# Patient Record
Sex: Female | Born: 1985 | Race: Black or African American | Hispanic: No | Marital: Married | Smoking: Never smoker
Health system: Southern US, Community
[De-identification: ages and names within clinical notes are randomized; demographics above are authoritative.]

## PROBLEM LIST (undated history)

## (undated) DIAGNOSIS — Z8619 Personal history of other infectious and parasitic diseases: Secondary | ICD-10-CM

## (undated) HISTORY — PX: FOOT SURGERY: SHX648

## (undated) HISTORY — DX: Personal history of other infectious and parasitic diseases: Z86.19

---

## 2010-11-06 ENCOUNTER — Other Ambulatory Visit (HOSPITAL_COMMUNITY)
Admission: RE | Admit: 2010-11-06 | Discharge: 2010-11-06 | Disposition: A | Discharge status: Home / Self Care | Payer: BC Managed Care – PPO | Source: Ambulatory Visit | Attending: Obstetrics and Gynecology | Admitting: Obstetrics and Gynecology

## 2010-11-06 DIAGNOSIS — Z113 Encounter for screening for infections with a predominantly sexual mode of transmission: Secondary | ICD-10-CM | POA: Insufficient documentation

## 2010-11-06 DIAGNOSIS — Z01419 Encounter for gynecological examination (general) (routine) without abnormal findings: Secondary | ICD-10-CM | POA: Insufficient documentation

## 2011-09-10 LAB — OB RESULTS CONSOLE HIV ANTIBODY (ROUTINE TESTING): HIV: NONREACTIVE

## 2011-09-10 LAB — OB RESULTS CONSOLE HEPATITIS B SURFACE ANTIGEN: Hepatitis B Surface Ag: NEGATIVE

## 2011-09-10 LAB — OB RESULTS CONSOLE RUBELLA ANTIBODY, IGM: Rubella: IMMUNE

## 2011-09-10 LAB — OB RESULTS CONSOLE ABO/RH

## 2011-09-10 LAB — OB RESULTS CONSOLE GC/CHLAMYDIA: Chlamydia: NEGATIVE

## 2011-09-10 LAB — OB RESULTS CONSOLE RPR: RPR: NONREACTIVE

## 2012-04-10 ENCOUNTER — Telehealth (HOSPITAL_COMMUNITY): Payer: Self-pay | Admitting: *Deleted

## 2012-04-10 ENCOUNTER — Encounter (HOSPITAL_COMMUNITY): Payer: Self-pay | Admitting: *Deleted

## 2012-04-10 NOTE — Telephone Encounter (Signed)
Preadmission screen  

## 2012-04-13 ENCOUNTER — Telehealth (HOSPITAL_COMMUNITY): Payer: Self-pay | Admitting: *Deleted

## 2012-04-13 ENCOUNTER — Encounter (HOSPITAL_COMMUNITY): Payer: Self-pay | Admitting: *Deleted

## 2012-04-13 NOTE — Telephone Encounter (Signed)
Preadmission screen  

## 2012-04-16 ENCOUNTER — Telehealth (HOSPITAL_COMMUNITY): Payer: Self-pay | Admitting: *Deleted

## 2012-04-16 NOTE — Telephone Encounter (Signed)
Preadmission screen  

## 2012-04-20 ENCOUNTER — Encounter (HOSPITAL_COMMUNITY): Payer: Self-pay | Admitting: *Deleted

## 2012-04-20 ENCOUNTER — Inpatient Hospital Stay (HOSPITAL_COMMUNITY)
Admission: AD | Admit: 2012-04-20 | Discharge: 2012-04-24 | DRG: 371 | Disposition: A | Discharge status: Home / Self Care | Payer: BC Managed Care – PPO | Source: Ambulatory Visit | Attending: Obstetrics and Gynecology | Admitting: Obstetrics and Gynecology

## 2012-04-20 DIAGNOSIS — O4100X0 Oligohydramnios, unspecified trimester, not applicable or unspecified: Principal | ICD-10-CM

## 2012-04-20 DIAGNOSIS — O480 Post-term pregnancy: Secondary | ICD-10-CM

## 2012-04-20 DIAGNOSIS — O99892 Other specified diseases and conditions complicating childbirth: Secondary | ICD-10-CM | POA: Diagnosis present

## 2012-04-20 DIAGNOSIS — Z22330 Carrier of Group B streptococcus: Secondary | ICD-10-CM

## 2012-04-20 DIAGNOSIS — Z98891 History of uterine scar from previous surgery: Secondary | ICD-10-CM

## 2012-04-20 LAB — CBC
Hemoglobin: 11.8 g/dL — ABNORMAL LOW (ref 12.0–15.0)
MCHC: 33.2 g/dL (ref 30.0–36.0)
Platelets: 250 10*3/uL (ref 150–400)

## 2012-04-20 LAB — RPR: RPR Ser Ql: NONREACTIVE

## 2012-04-20 LAB — TYPE AND SCREEN: Antibody Screen: NEGATIVE

## 2012-04-20 MED ORDER — MISOPROSTOL 25 MCG QUARTER TABLET
25.0000 ug | ORAL_TABLET | ORAL | Status: DC | PRN
  Administered 2012-04-20 (×2): 25 ug via VAGINAL
  Filled 2012-04-20 (×2): qty 0.25

## 2012-04-20 MED ORDER — PENICILLIN G POTASSIUM 5000000 UNITS IJ SOLR
2.5000 10*6.[IU] | INTRAVENOUS | Status: DC
  Filled 2012-04-20 (×3): qty 2.5

## 2012-04-20 MED ORDER — IBUPROFEN 600 MG PO TABS: 600.0000 mg | ORAL_TABLET | Freq: Four times a day (QID) | ORAL | Status: DC | PRN

## 2012-04-20 MED ORDER — OXYTOCIN BOLUS FROM INFUSION: 500.0000 mL | INTRAVENOUS | Status: DC

## 2012-04-20 MED ORDER — OXYCODONE-ACETAMINOPHEN 5-325 MG PO TABS: 1.0000 | ORAL_TABLET | ORAL | Status: DC | PRN

## 2012-04-20 MED ORDER — ZOLPIDEM TARTRATE 5 MG PO TABS
5.0000 mg | ORAL_TABLET | Freq: Every evening | ORAL | Status: DC | PRN
  Administered 2012-04-21: 5 mg via ORAL
  Filled 2012-04-20 (×2): qty 1

## 2012-04-20 MED ORDER — LACTATED RINGERS IV SOLN
INTRAVENOUS | Status: DC
  Administered 2012-04-20: 125 mL via INTRAVENOUS
  Administered 2012-04-20 – 2012-04-21 (×4): via INTRAVENOUS

## 2012-04-20 MED ORDER — PENICILLIN G POTASSIUM 5000000 UNITS IJ SOLR
5.0000 10*6.[IU] | Freq: Once | INTRAVENOUS | Status: DC
  Filled 2012-04-20: qty 5

## 2012-04-20 MED ORDER — PENICILLIN G POTASSIUM 5000000 UNITS IJ SOLR
5.0000 10*6.[IU] | Freq: Once | INTRAVENOUS | Status: AC
  Administered 2012-04-20: 5 10*6.[IU] via INTRAVENOUS
  Filled 2012-04-20: qty 5

## 2012-04-20 MED ORDER — ONDANSETRON HCL 4 MG/2ML IJ SOLN: 4.0000 mg | Freq: Four times a day (QID) | INTRAMUSCULAR | Status: DC | PRN

## 2012-04-20 MED ORDER — LACTATED RINGERS IV SOLN
500.0000 mL | INTRAVENOUS | Status: DC | PRN
  Administered 2012-04-21: 1000 mL via INTRAVENOUS

## 2012-04-20 MED ORDER — CITRIC ACID-SODIUM CITRATE 334-500 MG/5ML PO SOLN
30.0000 mL | ORAL | Status: DC | PRN
  Administered 2012-04-21: 30 mL via ORAL
  Filled 2012-04-20: qty 15

## 2012-04-20 MED ORDER — PENICILLIN G POTASSIUM 5000000 UNITS IJ SOLR
2.5000 10*6.[IU] | INTRAVENOUS | Status: DC
  Administered 2012-04-21 (×3): 2.5 10*6.[IU] via INTRAVENOUS
  Filled 2012-04-20 (×7): qty 2.5

## 2012-04-20 MED ORDER — CLINDAMYCIN PHOSPHATE 900 MG/50ML IV SOLN
900.0000 mg | Freq: Once | INTRAVENOUS | Status: DC
  Filled 2012-04-20: qty 50

## 2012-04-20 MED ORDER — LIDOCAINE HCL (PF) 1 % IJ SOLN: 30.0000 mL | INTRAMUSCULAR | Status: DC | PRN

## 2012-04-20 MED ORDER — ACETAMINOPHEN 325 MG PO TABS
650.0000 mg | ORAL_TABLET | ORAL | Status: DC | PRN
  Administered 2012-04-20: 650 mg via ORAL
  Filled 2012-04-20: qty 2

## 2012-04-20 MED ORDER — OXYTOCIN 40 UNITS IN LACTATED RINGERS INFUSION - SIMPLE MED
62.5000 mL/h | INTRAVENOUS | Status: DC
  Filled 2012-04-20: qty 1000

## 2012-04-20 MED ORDER — FLEET ENEMA 7-19 GM/118ML RE ENEM: 1.0000 | ENEMA | RECTAL | Status: DC | PRN

## 2012-04-20 MED ORDER — TERBUTALINE SULFATE 1 MG/ML IJ SOLN
0.2500 mg | Freq: Once | INTRAMUSCULAR | Status: AC | PRN
  Filled 2012-04-20: qty 1

## 2012-04-20 NOTE — Progress Notes (Signed)
Up to the bathroom 

## 2012-04-20 NOTE — H&P (Signed)
Jessica Barker is a 26 y.o. female presenting for  Induction due to oligohydramnios (AFI of 4.8) and post dates. She is 41 wks 0 days with EDD 04/13/2012. Pregnancy has been uncomplicated. . Her efw on u/s today was 8 lbs 8 oz.  History OB History    Grav Para Term Preterm Abortions TAB SAB Ect Mult Living   2    1  1         Past Medical History  Diagnosis Date  . H/O varicella    Past Surgical History  Procedure Date  . Foot surgery     Left foot   Family History: family history includes Cancer in her cousin, maternal aunt, maternal grandfather, and mother and Hypertension in her father, paternal grandfather, and paternal grandmother. Social History:  reports that she has never smoked. She has never used smokeless tobacco. She reports that she does not drink alcohol. Her drug history not on file.   Prenatal Transfer Tool  Maternal Diabetes: No Genetic Screening: Normal Maternal Ultrasounds/Referrals: Normal Fetal Ultrasounds or other Referrals:  None Maternal Substance Abuse:  no Significant Maternal Medications:  None Significant Maternal Lab Results:  Lab values include: Group B Strep positive Other Comments:  None  Review of Systems  All other systems reviewed and are negative.      Last menstrual period 07/08/2011. Maternal Exam:  Uterine Assessment: Contraction frequency is rare.   Abdomen: Patient reports no abdominal tenderness. Fundal height is 40 wks.   Estimated fetal weight is 8 lbs 8 oz on u/s 04/20/2012.   Fetal presentation: vertex  Introitus: Normal vulva. Normal vagina.  Pelvis: questionable for delivery.      Fetal Exam Fetal Monitor Review: Mode: ultrasound.   Baseline rate: 140.      Physical Exam  Constitutional: She is oriented to person, place, and time. She appears well-developed and well-nourished.  Cardiovascular: Normal rate and regular rhythm.   Respiratory: Effort normal and breath sounds normal.  GI: Bowel sounds are normal.    Genitourinary: Vagina normal.  Neurological: She is alert and oriented to person, place, and time.  Skin: Skin is warm and dry.  Psychiatric: She has a normal mood and affect.   Cervix 1/ th/ -2 Prenatal labs: ABO, Rh: A/Positive/-- (04/09 0000) Antibody: Negative (04/09 0000) Rubella: Immune (04/09 0000) RPR: Nonreactive (04/09 0000)  HBsAg: Negative (04/09 0000)  HIV: Non-reactive (04/09 0000)  GBS: Positive (09/15 0000)   Assessment/Plan: 41 wks 0 days with oligohydramnios in the office AFI 4.8 admitted for induction.  Plan on cytotec.  Penicillin with active labor or rom for GBS positive    Twain Stenseth J. 04/20/2012, 1:01 PM

## 2012-04-21 ENCOUNTER — Inpatient Hospital Stay (HOSPITAL_COMMUNITY): Admission: RE | Admit: 2012-04-21 | Payer: BC Managed Care – PPO | Source: Ambulatory Visit

## 2012-04-21 ENCOUNTER — Encounter (HOSPITAL_COMMUNITY): Payer: Self-pay | Admitting: *Deleted

## 2012-04-21 ENCOUNTER — Encounter (HOSPITAL_COMMUNITY): Payer: Self-pay | Admitting: Anesthesiology

## 2012-04-21 ENCOUNTER — Encounter (HOSPITAL_COMMUNITY)
Admission: AD | Disposition: A | Discharge status: Home / Self Care | Payer: Self-pay | Source: Ambulatory Visit | Attending: Obstetrics and Gynecology

## 2012-04-21 ENCOUNTER — Inpatient Hospital Stay (HOSPITAL_COMMUNITY): Payer: BC Managed Care – PPO | Admitting: Anesthesiology

## 2012-04-21 SURGERY — Surgical Case
Anesthesia: Epidural | Site: Abdomen | Wound class: Clean Contaminated

## 2012-04-21 MED ORDER — PHENYLEPHRINE 40 MCG/ML (10ML) SYRINGE FOR IV PUSH (FOR BLOOD PRESSURE SUPPORT)
80.0000 ug | PREFILLED_SYRINGE | INTRAVENOUS | Status: DC | PRN
  Filled 2012-04-21: qty 5

## 2012-04-21 MED ORDER — PRENATAL MULTIVITAMIN CH: 1.0000 | ORAL_TABLET | Freq: Every day | ORAL | Status: DC

## 2012-04-21 MED ORDER — NALBUPHINE SYRINGE 5 MG/0.5 ML
5.0000 mg | INJECTION | INTRAMUSCULAR | Status: DC | PRN
  Administered 2012-04-21: 10 mg via SUBCUTANEOUS
  Filled 2012-04-21: qty 1

## 2012-04-21 MED ORDER — MORPHINE SULFATE 0.5 MG/ML IJ SOLN
INTRAMUSCULAR | Status: AC
  Filled 2012-04-21: qty 10

## 2012-04-21 MED ORDER — NALOXONE HCL 1 MG/ML IJ SOLN
1.0000 ug/kg/h | INTRAVENOUS | Status: DC | PRN
  Filled 2012-04-21: qty 2

## 2012-04-21 MED ORDER — OXYTOCIN 10 UNIT/ML IJ SOLN
INTRAMUSCULAR | Status: AC
  Filled 2012-04-21: qty 4

## 2012-04-21 MED ORDER — LIDOCAINE HCL (PF) 1 % IJ SOLN
INTRAMUSCULAR | Status: DC | PRN
  Administered 2012-04-21 (×2): 4 mL

## 2012-04-21 MED ORDER — OXYTOCIN 40 UNITS IN LACTATED RINGERS INFUSION - SIMPLE MED: 62.5000 mL/h | INTRAVENOUS | Status: AC

## 2012-04-21 MED ORDER — CEFAZOLIN SODIUM-DEXTROSE 2-3 GM-% IV SOLR
INTRAVENOUS | Status: AC
  Filled 2012-04-21: qty 50

## 2012-04-21 MED ORDER — DIPHENHYDRAMINE HCL 50 MG/ML IJ SOLN: 25.0000 mg | INTRAMUSCULAR | Status: DC | PRN

## 2012-04-21 MED ORDER — IBUPROFEN 600 MG PO TABS
600.0000 mg | ORAL_TABLET | Freq: Four times a day (QID) | ORAL | Status: DC
  Administered 2012-04-22 – 2012-04-24 (×11): 600 mg via ORAL
  Filled 2012-04-21 (×11): qty 1

## 2012-04-21 MED ORDER — MENTHOL 3 MG MT LOZG: 1.0000 | LOZENGE | OROMUCOSAL | Status: DC | PRN

## 2012-04-21 MED ORDER — LACTATED RINGERS IV SOLN
INTRAVENOUS | Status: DC
  Administered 2012-04-22: 03:00:00 via INTRAVENOUS

## 2012-04-21 MED ORDER — ONDANSETRON HCL 4 MG/2ML IJ SOLN: 4.0000 mg | Freq: Three times a day (TID) | INTRAMUSCULAR | Status: DC | PRN

## 2012-04-21 MED ORDER — LACTATED RINGERS IV SOLN: 500.0000 mL | Freq: Once | INTRAVENOUS | Status: DC

## 2012-04-21 MED ORDER — METOCLOPRAMIDE HCL 5 MG/ML IJ SOLN: 10.0000 mg | Freq: Three times a day (TID) | INTRAMUSCULAR | Status: DC | PRN

## 2012-04-21 MED ORDER — ONDANSETRON HCL 4 MG PO TABS: 4.0000 mg | ORAL_TABLET | ORAL | Status: DC | PRN

## 2012-04-21 MED ORDER — ONDANSETRON HCL 4 MG/2ML IJ SOLN
INTRAMUSCULAR | Status: DC | PRN
  Administered 2012-04-21: 4 mg via INTRAVENOUS

## 2012-04-21 MED ORDER — TERBUTALINE SULFATE 1 MG/ML IJ SOLN
0.2500 mg | Freq: Once | INTRAMUSCULAR | Status: AC | PRN
  Administered 2012-04-21: 0.25 mg via SUBCUTANEOUS

## 2012-04-21 MED ORDER — MORPHINE SULFATE (PF) 0.5 MG/ML IJ SOLN
INTRAMUSCULAR | Status: DC | PRN
  Administered 2012-04-21: 3 ug via EPIDURAL

## 2012-04-21 MED ORDER — OXYCODONE-ACETAMINOPHEN 5-325 MG PO TABS
1.0000 | ORAL_TABLET | ORAL | Status: DC | PRN
  Administered 2012-04-21: 1 via ORAL
  Filled 2012-04-21: qty 1

## 2012-04-21 MED ORDER — SODIUM CHLORIDE 0.9 % IJ SOLN: 3.0000 mL | INTRAMUSCULAR | Status: DC | PRN

## 2012-04-21 MED ORDER — DIPHENHYDRAMINE HCL 50 MG/ML IJ SOLN: 12.5000 mg | INTRAMUSCULAR | Status: DC | PRN

## 2012-04-21 MED ORDER — LANOLIN HYDROUS EX OINT: 1.0000 "application " | TOPICAL_OINTMENT | CUTANEOUS | Status: DC | PRN

## 2012-04-21 MED ORDER — ZOLPIDEM TARTRATE 5 MG PO TABS: 5.0000 mg | ORAL_TABLET | Freq: Every evening | ORAL | Status: DC | PRN

## 2012-04-21 MED ORDER — WITCH HAZEL-GLYCERIN EX PADS: 1.0000 "application " | MEDICATED_PAD | CUTANEOUS | Status: DC | PRN

## 2012-04-21 MED ORDER — FERROUS SULFATE 325 (65 FE) MG PO TABS
325.0000 mg | ORAL_TABLET | Freq: Two times a day (BID) | ORAL | Status: DC
  Administered 2012-04-22 – 2012-04-24 (×5): 325 mg via ORAL
  Filled 2012-04-21 (×5): qty 1

## 2012-04-21 MED ORDER — NALBUPHINE SYRINGE 5 MG/0.5 ML
5.0000 mg | INJECTION | INTRAMUSCULAR | Status: DC | PRN
Start: 2012-04-21 — End: 2012-04-24
  Administered 2012-04-21 (×2): 5 mg via INTRAVENOUS
  Filled 2012-04-21: qty 1
  Filled 2012-04-21: qty 0.5
  Filled 2012-04-21: qty 1

## 2012-04-21 MED ORDER — NALOXONE HCL 0.4 MG/ML IJ SOLN: 0.4000 mg | INTRAMUSCULAR | Status: DC | PRN

## 2012-04-21 MED ORDER — MEPERIDINE HCL 25 MG/ML IJ SOLN: 6.2500 mg | INTRAMUSCULAR | Status: DC | PRN

## 2012-04-21 MED ORDER — IBUPROFEN 600 MG PO TABS: 600.0000 mg | ORAL_TABLET | Freq: Four times a day (QID) | ORAL | Status: DC | PRN

## 2012-04-21 MED ORDER — SIMETHICONE 80 MG PO CHEW: 80.0000 mg | CHEWABLE_TABLET | ORAL | Status: DC | PRN

## 2012-04-21 MED ORDER — EPHEDRINE 5 MG/ML INJ
10.0000 mg | INTRAVENOUS | Status: DC | PRN
  Filled 2012-04-21: qty 4

## 2012-04-21 MED ORDER — ONDANSETRON HCL 4 MG/2ML IJ SOLN
INTRAMUSCULAR | Status: AC
  Filled 2012-04-21: qty 2

## 2012-04-21 MED ORDER — PRENATAL MULTIVITAMIN CH
1.0000 | ORAL_TABLET | Freq: Every day | ORAL | Status: DC
  Administered 2012-04-21 – 2012-04-24 (×4): 1 via ORAL
  Filled 2012-04-21 (×4): qty 1

## 2012-04-21 MED ORDER — KETOROLAC TROMETHAMINE 30 MG/ML IJ SOLN
INTRAMUSCULAR | Status: AC
  Administered 2012-04-21: 30 mg via INTRAVENOUS
  Filled 2012-04-21: qty 1

## 2012-04-21 MED ORDER — LACTATED RINGERS IV SOLN
INTRAVENOUS | Status: DC | PRN
  Administered 2012-04-21 (×2): via INTRAVENOUS

## 2012-04-21 MED ORDER — KETOROLAC TROMETHAMINE 30 MG/ML IJ SOLN: 30.0000 mg | Freq: Four times a day (QID) | INTRAMUSCULAR | Status: AC | PRN

## 2012-04-21 MED ORDER — LACTATED RINGERS IV SOLN
INTRAVENOUS | Status: DC
  Administered 2012-04-21: 08:00:00 via INTRAUTERINE

## 2012-04-21 MED ORDER — FENTANYL CITRATE 0.05 MG/ML IJ SOLN
INTRAMUSCULAR | Status: AC
  Administered 2012-04-21: 50 ug via INTRAVENOUS
  Filled 2012-04-21: qty 2

## 2012-04-21 MED ORDER — EPHEDRINE 5 MG/ML INJ: 10.0000 mg | INTRAVENOUS | Status: DC | PRN

## 2012-04-21 MED ORDER — DIPHENHYDRAMINE HCL 25 MG PO CAPS
25.0000 mg | ORAL_CAPSULE | Freq: Four times a day (QID) | ORAL | Status: DC | PRN
  Administered 2012-04-22: 25 mg via ORAL
  Filled 2012-04-21: qty 1

## 2012-04-21 MED ORDER — OXYTOCIN 40 UNITS IN LACTATED RINGERS INFUSION - SIMPLE MED
1.0000 m[IU]/min | INTRAVENOUS | Status: DC
  Administered 2012-04-21: 2 m[IU]/min via INTRAVENOUS
  Administered 2012-04-21: 4 m[IU]/min via INTRAVENOUS

## 2012-04-21 MED ORDER — METHYLERGONOVINE MALEATE 0.2 MG/ML IJ SOLN: 0.2000 mg | INTRAMUSCULAR | Status: DC | PRN

## 2012-04-21 MED ORDER — DIBUCAINE 1 % RE OINT: 1.0000 "application " | TOPICAL_OINTMENT | RECTAL | Status: DC | PRN

## 2012-04-21 MED ORDER — CEFAZOLIN SODIUM-DEXTROSE 2-3 GM-% IV SOLR
2.0000 g | Freq: Once | INTRAVENOUS | Status: AC
  Administered 2012-04-21: 2 g via INTRAVENOUS
  Filled 2012-04-21: qty 50

## 2012-04-21 MED ORDER — METHYLERGONOVINE MALEATE 0.2 MG PO TABS: 0.2000 mg | ORAL_TABLET | ORAL | Status: DC | PRN

## 2012-04-21 MED ORDER — SCOPOLAMINE 1 MG/3DAYS TD PT72
1.0000 | MEDICATED_PATCH | Freq: Once | TRANSDERMAL | Status: AC
  Administered 2012-04-21: 1.5 mg via TRANSDERMAL

## 2012-04-21 MED ORDER — CALCIUM CARBONATE ANTACID 500 MG PO CHEW
1.0000 | CHEWABLE_TABLET | Freq: Every day | ORAL | Status: DC
  Administered 2012-04-21 – 2012-04-24 (×4): 200 mg via ORAL
  Filled 2012-04-21 (×4): qty 1

## 2012-04-21 MED ORDER — BUTORPHANOL TARTRATE 1 MG/ML IJ SOLN
1.0000 mg | Freq: Once | INTRAMUSCULAR | Status: AC
  Administered 2012-04-21: 1 mg via INTRAVENOUS
  Filled 2012-04-21: qty 1

## 2012-04-21 MED ORDER — PHENYLEPHRINE 40 MCG/ML (10ML) SYRINGE FOR IV PUSH (FOR BLOOD PRESSURE SUPPORT): 80.0000 ug | PREFILLED_SYRINGE | INTRAVENOUS | Status: DC | PRN

## 2012-04-21 MED ORDER — SENNOSIDES-DOCUSATE SODIUM 8.6-50 MG PO TABS
2.0000 | ORAL_TABLET | Freq: Every day | ORAL | Status: DC
  Administered 2012-04-21 – 2012-04-23 (×3): 2 via ORAL

## 2012-04-21 MED ORDER — OXYTOCIN 10 UNIT/ML IJ SOLN
40.0000 [IU] | INTRAVENOUS | Status: DC | PRN
  Administered 2012-04-21: 40 [IU] via INTRAVENOUS

## 2012-04-21 MED ORDER — SIMETHICONE 80 MG PO CHEW
80.0000 mg | CHEWABLE_TABLET | Freq: Three times a day (TID) | ORAL | Status: DC
  Administered 2012-04-21 – 2012-04-24 (×10): 80 mg via ORAL

## 2012-04-21 MED ORDER — FENTANYL CITRATE 0.05 MG/ML IJ SOLN
25.0000 ug | INTRAMUSCULAR | Status: DC | PRN
  Administered 2012-04-21: 50 ug via INTRAVENOUS

## 2012-04-21 MED ORDER — SODIUM BICARBONATE 8.4 % IV SOLN
INTRAVENOUS | Status: DC | PRN
  Administered 2012-04-21: 5 mL via EPIDURAL

## 2012-04-21 MED ORDER — FENTANYL 2.5 MCG/ML BUPIVACAINE 1/10 % EPIDURAL INFUSION (WH - ANES)
INTRAMUSCULAR | Status: DC | PRN
  Administered 2012-04-21: 14 mL/h via EPIDURAL

## 2012-04-21 MED ORDER — ONDANSETRON HCL 4 MG/2ML IJ SOLN: 4.0000 mg | INTRAMUSCULAR | Status: DC | PRN

## 2012-04-21 MED ORDER — FENTANYL 2.5 MCG/ML BUPIVACAINE 1/10 % EPIDURAL INFUSION (WH - ANES)
14.0000 mL/h | INTRAMUSCULAR | Status: DC
  Filled 2012-04-21: qty 125

## 2012-04-21 MED ORDER — DIPHENHYDRAMINE HCL 25 MG PO CAPS: 25.0000 mg | ORAL_CAPSULE | ORAL | Status: DC | PRN

## 2012-04-21 MED ORDER — SCOPOLAMINE 1 MG/3DAYS TD PT72
MEDICATED_PATCH | TRANSDERMAL | Status: AC
  Administered 2012-04-21: 1.5 mg via TRANSDERMAL
  Filled 2012-04-21: qty 1

## 2012-04-21 SURGICAL SUPPLY — 40 items
BARRIER ADHS 3X4 INTERCEED (GAUZE/BANDAGES/DRESSINGS) ×2 IMPLANT
BENZOIN TINCTURE PRP APPL 2/3 (GAUZE/BANDAGES/DRESSINGS) ×4 IMPLANT
CLOTH BEACON ORANGE TIMEOUT ST (SAFETY) ×2 IMPLANT
CONTAINER PREFILL 10% NBF 15ML (MISCELLANEOUS) IMPLANT
DRESSING TELFA 8X3 (GAUZE/BANDAGES/DRESSINGS) ×2 IMPLANT
DRSG COVADERM 4X10 (GAUZE/BANDAGES/DRESSINGS) IMPLANT
DURAPREP 26ML APPLICATOR (WOUND CARE) ×2 IMPLANT
ELECT REM PT RETURN 9FT ADLT (ELECTROSURGICAL) ×2
ELECTRODE REM PT RTRN 9FT ADLT (ELECTROSURGICAL) ×1 IMPLANT
EXTRACTOR VACUUM M CUP 4 TUBE (SUCTIONS) IMPLANT
GAUZE SPONGE 4X4 12PLY STRL LF (GAUZE/BANDAGES/DRESSINGS) ×2 IMPLANT
GLOVE BIOGEL M 6.5 STRL (GLOVE) ×4 IMPLANT
GLOVE BIOGEL PI IND STRL 6.5 (GLOVE) ×2 IMPLANT
GLOVE BIOGEL PI INDICATOR 6.5 (GLOVE) ×2
GOWN PREVENTION PLUS LG XLONG (DISPOSABLE) ×2 IMPLANT
GOWN PREVENTION PLUS XLARGE (GOWN DISPOSABLE) ×2 IMPLANT
KIT ABG SYR 3ML LUER SLIP (SYRINGE) IMPLANT
NEEDLE HYPO 25X5/8 SAFETYGLIDE (NEEDLE) IMPLANT
NS IRRIG 1000ML POUR BTL (IV SOLUTION) ×2 IMPLANT
PACK C SECTION WH (CUSTOM PROCEDURE TRAY) ×2 IMPLANT
PAD ABD 7.5X8 STRL (GAUZE/BANDAGES/DRESSINGS) ×2 IMPLANT
PAD OB MATERNITY 4.3X12.25 (PERSONAL CARE ITEMS) IMPLANT
RTRCTR C-SECT PINK 25CM LRG (MISCELLANEOUS) IMPLANT
RTRCTR C-SECT PINK 34CM XLRG (MISCELLANEOUS) IMPLANT
SLEEVE SCD COMPRESS KNEE MED (MISCELLANEOUS) IMPLANT
SPONGE GAUZE 4X4 12PLY (GAUZE/BANDAGES/DRESSINGS) ×2 IMPLANT
STAPLER VISISTAT 35W (STAPLE) IMPLANT
STRIP CLOSURE SKIN 1/2X4 (GAUZE/BANDAGES/DRESSINGS) ×4 IMPLANT
SUT PDS AB 0 CT1 27 (SUTURE) ×4 IMPLANT
SUT PLAIN 0 NONE (SUTURE) IMPLANT
SUT VIC AB 0 CTX 36 (SUTURE) ×3
SUT VIC AB 0 CTX36XBRD ANBCTRL (SUTURE) ×3 IMPLANT
SUT VIC AB 2-0 CT1 27 (SUTURE) ×1
SUT VIC AB 2-0 CT1 TAPERPNT 27 (SUTURE) ×1 IMPLANT
SUT VIC AB 3-0 SH 27 (SUTURE)
SUT VIC AB 3-0 SH 27X BRD (SUTURE) IMPLANT
SUT VIC AB 4-0 KS 27 (SUTURE) ×2 IMPLANT
TOWEL OR 17X24 6PK STRL BLUE (TOWEL DISPOSABLE) ×4 IMPLANT
TRAY FOLEY CATH 14FR (SET/KITS/TRAYS/PACK) ×2 IMPLANT
WATER STERILE IRR 1000ML POUR (IV SOLUTION) ×2 IMPLANT

## 2012-04-21 NOTE — Consult Note (Signed)
Neonatology Note:   Attendance at C-section:    I was asked to attend this Stat primary C/S at [redacted] weeks GA due to prolonged fetal bradycardia. The mother is a G2P0A1 A pos, GBS pos with induction of labor for post-dates and low AFI. An amnioinfusion had been done. The mother received Pen G beginning about 9 hours prior to delivery. ROM 4 hours prior to delivery, fluid with moderate meconium. Infant vigorous with good spontaneous cry and tone. Needed only minimal bulb suctioning. Ap 9/9. Lungs clear to ausc in DR. To CN to care of Pediatrician.   Deatra James, MD

## 2012-04-21 NOTE — Op Note (Signed)
Cesarean Section Procedure Note  Indications: non-reassuring fetal status  Pre-operative Diagnosis: 41 week 1 day pregnancy.  Post-operative Diagnosis: same  Surgeon: Jessee Avers.   Assistants: none   Anesthesia: Epidural anesthesia  ASA Class: 2   Procedure Details   The patient was seen in the Holding Room. The risks, benefits, complications, treatment options, and expected outcomes were discussed with the patient.  The patient concurred with the proposed plan, giving informed consent.  The site of surgery properly noted/marked. The patient was taken to Operating Room # 1, identified as [place] and the procedure verified as C-Section Delivery. A Time Out was held and the above information confirmed.  After induction of anesthesia, the patient was draped and prepped in the usual sterile manner. A Pfannenstiel incision was made and carried down through the subcutaneous tissue to the fascia. Fascial incision was made and extended transversely. The fascia was separated from the underlying rectus tissue superiorly and inferiorly. The peritoneum was identified and entered. Peritoneal incision was extended longitudinally. The utero-vesical peritoneal reflection was incised transversely and the bladder flap was bluntly freed from the lower uterine segment. A low transverse uterine incision was made. Delivered from cephalic presentation was a ??? gram Female with Apgar scores of 9 at one minute and 9 at five minutes. After the umbilical cord was clamped and cut cord blood was obtained for evaluation. The placenta was removed intact and appeared normal. The uterine outline, tubes and ovaries appeared normal. The uterine incision was closed with running locked sutures of 0 vicryl and imbricated with a second layer of 0 vicryl . Hemostasis was observed. Lavage was carried out until clear. The fascia was then reapproximated with running sutures of 0 pds. The skin was reapproximated with 4-0 vicryl  .  Instrument, sponge, and needle counts were correct prior the abdominal closure and at the conclusion of the case.   Findings: Female infant.. Thin meconium.. Normal tubes and ovaries   Estimated Blood Loss:  per anesthesia          Drains: foley          Total IV Fluids:  Per anesthesia ml         Specimens: Placenta and to labor and delivery            Implants: none         Complications:  None; patient tolerated the procedure well.         Disposition: PACU - hemodynamically stable.         Condition: stable  Attending Attestation: I performed the procedure.

## 2012-04-21 NOTE — Progress Notes (Signed)
Pt with c/o feeling the need to have a BM, assisted to the bathroom

## 2012-04-21 NOTE — Progress Notes (Signed)
Jessica Barker is a 26 y.o. G2P0010 at [redacted]w[redacted]d by LMP admitted for induction of labor due to Low amniotic fluid.. And post dates.   Subjective: Pt states that contractions are 5 out 10. No lof no vaginal bleeding . She is interested in an epidural   Objective: BP 127/82  Pulse 78  Temp 97.6 F (36.4 C) (Oral)  Resp 18  Ht 5\' 2"  (1.575 m)  Wt 102.513 kg (226 lb)  BMI 41.34 kg/m2  SpO2 99%  LMP 07/08/2011 I/O last 3 completed shifts: In: 3050 [P.O.:480; I.V.:2470; IV Piggyback:100] Out: 1000 [Urine:1000]    FHT:140's + accelerations variable decelrations Category I tracing.   UC:   regular, every 4 minutes SVE:   Dilation: 3 Effacement (%): 80 Station: -2 Exam by:: dr. Richardson Dopp Arom thin meconium IUPC and FSE placed.   Labs: Lab Results  Component Value Date   WBC 8.7 04/20/2012   HGB 11.8* 04/20/2012   HCT 35.5* 04/20/2012   MCV 88.5 04/20/2012   PLT 250 04/20/2012    Assessment / Plan: Induction of labor due to postterm and oligohydramnios ,  progressing well on pitocin  Labor: Progressing normally if MvU's are not at least 200 in 1 hr will start pitocin  Preeclampsia:  no signs or symptoms of toxicity Fetal Wellbeing:  Category I Pain Control:  pt desires an epidural  I/D:  Penicillin for gbs prophylaxis  Anticipated MOD:  NSVD  [name] 04/21/2012, 8:03 AM

## 2012-04-21 NOTE — Anesthesia Postprocedure Evaluation (Signed)
Anesthesia Post Note  Patient: Jessica Barker  Procedure(s) Performed: Procedure(s) (LRB): CESAREAN SECTION (N/A)  Anesthesia type: Epidural  Patient location: PACU  Post pain: Pain level controlled  Post assessment: Post-op Vital signs reviewed  Last Vitals:  Filed Vitals:   04/21/12 1315  BP: 137/85  Pulse: 98  Temp:   Resp: 21    Post vital signs: Reviewed  Level of consciousness: awake  Complications: No apparent anesthesia complications

## 2012-04-21 NOTE — Anesthesia Preprocedure Evaluation (Addendum)
Anesthesia Evaluation  Patient identified by MRN, date of birth, ID band Patient awake    Reviewed: Allergy & Precautions, H&P , Patient's Chart, lab work & pertinent test results  Airway Mallampati: III TM Distance: >3 FB Neck ROM: full    Dental No notable dental hx. (+) Teeth Intact   Pulmonary neg pulmonary ROS,  breath sounds clear to auscultation  Pulmonary exam normal       Cardiovascular negative cardio ROS  Rhythm:regular Rate:Normal     Neuro/Psych negative neurological ROS  negative psych ROS   GI/Hepatic negative GI ROS, Neg liver ROS,   Endo/Other  Morbid obesity  Renal/GU negative Renal ROS  negative genitourinary   Musculoskeletal   Abdominal Normal abdominal exam  (+)   Peds  Hematology negative hematology ROS (+)   Anesthesia Other Findings   Reproductive/Obstetrics (+) Pregnancy                           Anesthesia Physical Anesthesia Plan  ASA: III and emergent  Anesthesia Plan: Epidural   Post-op Pain Management:    Induction:   Airway Management Planned:   Additional Equipment:   Intra-op Plan:   Post-operative Plan:   Informed Consent: I have reviewed the patients History and Physical, chart, labs and discussed the procedure including the risks, benefits and alternatives for the proposed anesthesia with the patient or authorized representative who has indicated his/her understanding and acceptance.     Plan Discussed with: Anesthesiologist  Anesthesia Plan Comments:        Anesthesia Quick Evaluation

## 2012-04-21 NOTE — Transfer of Care (Signed)
Immediate Anesthesia Transfer of Care Note  Patient: Jessica Barker  Procedure(s) Performed: Procedure(s) (LRB) with comments: CESAREAN SECTION (N/A)  Patient Location: PACU  Anesthesia Type:Epidural  Level of Consciousness: awake  Airway & Oxygen Therapy: Patient Spontanous Breathing  Post-op Assessment: Report given to PACU RN and Post -op Vital signs reviewed and stable  Post vital signs: Reviewed and stable  Complications: No apparent anesthesia complications

## 2012-04-21 NOTE — Anesthesia Procedure Notes (Signed)
Epidural Patient location during procedure: OB Start time: 04/21/2012 9:06 AM  Staffing Anesthesiologist: Lorynn Moeser A. Performed by: anesthesiologist   Preanesthetic Checklist Completed: patient identified, site marked, surgical consent, pre-op evaluation, timeout performed, IV checked, risks and benefits discussed and monitors and equipment checked  Epidural Patient position: sitting Prep: site prepped and draped and DuraPrep Patient monitoring: continuous pulse ox and blood pressure Approach: midline Injection technique: LOR air  Needle:  Needle type: Tuohy  Needle gauge: 17 G Needle length: 9 cm and 9 Needle insertion depth: 7 cm Catheter type: closed end flexible Catheter size: 19 Gauge Catheter at skin depth: 12 cm Test dose: negative and Other  Assessment Events: blood not aspirated, injection not painful, no injection resistance, negative IV test and no paresthesia  Additional Notes Patient identified. Risks and benefits discussed including failed block, incomplete  Pain control, post dural puncture headache, nerve damage, paralysis, blood pressure Changes, nausea, vomiting, reactions to medications-both toxic and allergic and post Partum back pain. All questions were answered. Patient expressed understanding and wished to proceed. Sterile technique was used throughout procedure. Epidural site was Dressed with sterile barrier dressing. No paresthesias, signs of intravascular injection Or signs of intrathecal spread were encountered.  Patient was more comfortable after the epidural was dosed. Please see RN's note for documentation of vital signs and FHR which are stable.

## 2012-04-21 NOTE — Progress Notes (Signed)
md aware

## 2012-04-21 NOTE — Progress Notes (Signed)
Late entry Called to assess patient at 11:13 due to fetal bradycardia to the 60's for 6 minutes un responsive to 02 , left lateral decub/ fluid bolus and cessation of pitocin. Pt 4 cm on exam . Fetal heart rate upon arrival 120's with minimal variability and continued decelerations.  Recommend cesarean section due to nonreassuring fetal heart rate. R/B/A of surgery discussed with the patient including but not limited to infection / bleeding / damage to bowel bladder baby with the need for further surgery. R/O transfusion HIV / Hep B&C discussed. Pt voiced understanding and desires to proceed.

## 2012-04-22 ENCOUNTER — Encounter (HOSPITAL_COMMUNITY): Payer: Self-pay | Admitting: Obstetrics and Gynecology

## 2012-04-22 LAB — CBC
MCH: 29.2 pg (ref 26.0–34.0)
MCV: 89.5 fL (ref 78.0–100.0)
Platelets: 204 10*3/uL (ref 150–400)
RBC: 3.25 MIL/uL — ABNORMAL LOW (ref 3.87–5.11)
RDW: 15.5 % (ref 11.5–15.5)
WBC: 11.8 10*3/uL — ABNORMAL HIGH (ref 4.0–10.5)

## 2012-04-22 NOTE — Anesthesia Postprocedure Evaluation (Signed)
  Anesthesia Post-op Note  Patient: Jessica Barker  Procedure(s) Performed: Procedure(s) (LRB) with comments: CESAREAN SECTION (N/A)  Patient Location: PACU and Mother/Baby  Anesthesia Type:Epidural  Level of Consciousness: awake, alert  and oriented  Airway and Oxygen Therapy: Patient Spontanous Breathing  Post-op Pain: mild  Post-op Assessment: Patient's Cardiovascular Status Stable, PATIENT'S CARDIOVASCULAR STATUS UNSTABLE, No signs of Nausea or vomiting, Adequate PO intake and Pain level controlled  Post-op Vital Signs: stable  Complications: No apparent anesthesia complications

## 2012-04-22 NOTE — Progress Notes (Signed)
Post Partum Day  1 s/p cesarean section  Subjective: no complaints, up ad lib, voiding and tolerating PO  Objective: Blood pressure 120/71, pulse 75, temperature 97.4 F (36.3 C), temperature source Oral, resp. rate 18, height 5\' 2"  (1.575 m), weight 102.513 kg (226 lb), last menstrual period 07/08/2011, SpO2 98.00%, unknown if currently breastfeeding.  Physical Exam:  General: alert and cooperative Lochia: appropriate Uterine Fundus: firm Incision: Bandage clean dry and intact  DVT Evaluation: No evidence of DVT seen on physical exam.   Basename 04/22/12 0500 04/20/12 1310  HGB 9.4* 11.8*  HCT 29.1* 35.5*    Assessment/Plan: Breastfeeding, Lactation consult and Circumcision prior to discharge   LOS: 2 days   Michi Herrmann J. 04/22/2012, 12:34 PM

## 2012-04-22 NOTE — Addendum Note (Signed)
Addendum  created 04/22/12 1610 by Elbert Ewings, CRNA   Modules edited:Notes Section

## 2012-04-23 NOTE — Progress Notes (Signed)
Subjective: Postpartum Day 2: Cesarean Delivery Patient reports tolerating PO.  Some difficulty with nursing.  Objective: Vital signs in last 24 hours: Temp:  [97.3 F (36.3 C)-97.6 F (36.4 C)] 97.4 F (36.3 C) (11/21 0645) Pulse Rate:  [76-88] 76  (11/21 0645) Resp:  [16-18] 18  (11/21 0645) BP: (112-129)/(66-74) 112/72 mmHg (11/21 0645) SpO2:  [98 %-99 %] 99 % (11/20 2120)  Physical Exam:  General: alert, cooperative and no distress.  Baby in chair at bedside nursing. Lochia: not assessed at perineum. Uterine Fundus: firm Incision: healing well DVT Evaluation: No evidence of DVT seen on physical exam. Calf/Ankle edema is present.   Basename 04/22/12 0500  HGB 9.4*  HCT 29.1*    Assessment/Plan: Status post Cesarean section. Doing well postoperatively.  Continue current care. Lactation consultant at bedside after exam. Discharge home in am.  Geryl Rankins 04/23/2012, 1:22 PM

## 2012-04-24 ENCOUNTER — Encounter (HOSPITAL_COMMUNITY)
Admission: RE | Admit: 2012-04-24 | Discharge: 2012-04-24 | Disposition: A | Discharge status: Home / Self Care | Payer: BC Managed Care – PPO | Source: Ambulatory Visit | Attending: Obstetrics and Gynecology | Admitting: Obstetrics and Gynecology

## 2012-04-24 DIAGNOSIS — O923 Agalactia: Secondary | ICD-10-CM | POA: Insufficient documentation

## 2012-04-24 DIAGNOSIS — O4100X0 Oligohydramnios, unspecified trimester, not applicable or unspecified: Principal | ICD-10-CM

## 2012-04-24 DIAGNOSIS — O480 Post-term pregnancy: Secondary | ICD-10-CM

## 2012-04-24 MED ORDER — IBUPROFEN 600 MG PO TABS: 600.0000 mg | ORAL_TABLET | Freq: Four times a day (QID) | ORAL | Status: DC | PRN

## 2012-04-24 MED ORDER — FERROUS SULFATE 325 (65 FE) MG PO TABS: 325.0000 mg | ORAL_TABLET | Freq: Every day | ORAL | Status: DC

## 2012-04-24 MED ORDER — OXYCODONE-ACETAMINOPHEN 5-325 MG PO TABS: 1.0000 | ORAL_TABLET | Freq: Four times a day (QID) | ORAL | Status: DC | PRN

## 2012-04-24 NOTE — Progress Notes (Signed)
Post Partum Day 3 s/p cesarean section  Subjective: no complaints, up ad lib, voiding and tolerating PO  Objective: Blood pressure 112/79, pulse 76, temperature 97.6 F (36.4 C), temperature source Oral, resp. rate 18, height 5\' 2"  (1.575 m), weight 102.513 kg (226 lb), last menstrual period 07/08/2011, SpO2 99.00%, unknown if currently breastfeeding.  Physical Exam:  General: alert and cooperative Lochia: appropriate Uterine Fundus: firm Incision: healing well DVT Evaluation: No evidence of DVT seen on physical exam.   Basename 04/22/12 0500  HGB 9.4*  HCT 29.1*    Assessment/Plan: Discharge home and Breastfeeding   LOS: 4 days   Gursimran Litaker J. 04/24/2012, 8:25 AM

## 2012-05-04 NOTE — Discharge Summary (Signed)
Obstetric Discharge Summary Reason for Admission: oligohydramnios/ post dates  Prenatal Procedures: ultrasound Intrapartum Procedures: cesarean: low cervical, transverse Postpartum Procedures: none Complications-Operative and Postpartum: none Hemoglobin  Date Value Range Status  04/22/2012 9.4* 12.0 - 15.0 g/dL Final     DELTA CHECK NOTED     REPEATED TO VERIFY     HCT  Date Value Range Status  04/22/2012 29.1* 36.0 - 46.0 % Final    Physical Exam:  General: alert and cooperative Lochia: appropriate Uterine Fundus: firm Incision: healing well DVT Evaluation: No evidence of DVT seen on physical exam.  Discharge Diagnoses: Term Pregnancy-delivered  Discharge Information: Date: 05/04/2012 Activity: pelvic rest Diet: routine Medications: PNV, Ibuprofen and Percocet Condition: stable Instructions: refer to practice specific booklet Discharge to: home Follow-up Information    Follow up with Jessee Avers., MD. Schedule an appointment as soon as possible for a visit in 2 weeks. (incision check )    Contact information:   [address] [place] [address] [phone]          Newborn Data: Live born female  Birth Weight: 7 lb 14.8 oz (3595 g) APGAR: 9, 9  Home with mother.  Riyanshi Wahab J. 05/04/2012, 7:00 AM

## 2012-11-30 ENCOUNTER — Other Ambulatory Visit (HOSPITAL_COMMUNITY)
Admission: RE | Admit: 2012-11-30 | Discharge: 2012-11-30 | Disposition: A | Discharge status: Home / Self Care | Payer: BC Managed Care – PPO | Source: Ambulatory Visit | Attending: Obstetrics and Gynecology | Admitting: Obstetrics and Gynecology

## 2012-11-30 ENCOUNTER — Other Ambulatory Visit: Payer: Self-pay | Admitting: Obstetrics and Gynecology

## 2012-11-30 DIAGNOSIS — Z01419 Encounter for gynecological examination (general) (routine) without abnormal findings: Secondary | ICD-10-CM | POA: Insufficient documentation

## 2013-12-15 ENCOUNTER — Other Ambulatory Visit (HOSPITAL_COMMUNITY)
Admission: RE | Admit: 2013-12-15 | Discharge: 2013-12-15 | Disposition: A | Discharge status: Home / Self Care | Payer: BC Managed Care – PPO | Source: Ambulatory Visit | Attending: Obstetrics and Gynecology | Admitting: Obstetrics and Gynecology

## 2013-12-15 ENCOUNTER — Other Ambulatory Visit: Payer: Self-pay | Admitting: Obstetrics and Gynecology

## 2013-12-15 DIAGNOSIS — Z1151 Encounter for screening for human papillomavirus (HPV): Secondary | ICD-10-CM | POA: Insufficient documentation

## 2013-12-15 DIAGNOSIS — Z01419 Encounter for gynecological examination (general) (routine) without abnormal findings: Secondary | ICD-10-CM | POA: Insufficient documentation

## 2013-12-16 LAB — CYTOLOGY - PAP

## 2014-04-04 ENCOUNTER — Encounter (HOSPITAL_COMMUNITY): Payer: Self-pay | Admitting: Obstetrics and Gynecology

## 2015-12-28 ENCOUNTER — Other Ambulatory Visit (HOSPITAL_COMMUNITY)
Admission: RE | Admit: 2015-12-28 | Discharge: 2015-12-28 | Disposition: A | Discharge status: Home / Self Care | Payer: BLUE CROSS/BLUE SHIELD | Source: Ambulatory Visit | Attending: Obstetrics and Gynecology | Admitting: Obstetrics and Gynecology

## 2015-12-28 ENCOUNTER — Other Ambulatory Visit: Payer: Self-pay | Admitting: Obstetrics and Gynecology

## 2015-12-28 DIAGNOSIS — Z01419 Encounter for gynecological examination (general) (routine) without abnormal findings: Secondary | ICD-10-CM | POA: Insufficient documentation

## 2016-01-01 LAB — CYTOLOGY - PAP

## 2016-12-27 LAB — OB RESULTS CONSOLE HEPATITIS B SURFACE ANTIGEN: Hepatitis B Surface Ag: NEGATIVE

## 2016-12-27 LAB — OB RESULTS CONSOLE GC/CHLAMYDIA
Chlamydia: NEGATIVE
Gonorrhea: NEGATIVE

## 2016-12-27 LAB — OB RESULTS CONSOLE RPR: RPR: NONREACTIVE

## 2016-12-27 LAB — OB RESULTS CONSOLE ABO/RH: RH TYPE: POSITIVE

## 2016-12-27 LAB — OB RESULTS CONSOLE ANTIBODY SCREEN: ANTIBODY SCREEN: NEGATIVE

## 2016-12-27 LAB — OB RESULTS CONSOLE HIV ANTIBODY (ROUTINE TESTING): HIV: NONREACTIVE

## 2017-01-27 ENCOUNTER — Other Ambulatory Visit (HOSPITAL_COMMUNITY): Payer: Self-pay | Admitting: Obstetrics and Gynecology

## 2017-01-27 DIAGNOSIS — IMO0002 Reserved for concepts with insufficient information to code with codable children: Secondary | ICD-10-CM

## 2017-01-30 ENCOUNTER — Ambulatory Visit (HOSPITAL_COMMUNITY)
Admission: RE | Admit: 2017-01-30 | Discharge: 2017-01-30 | Disposition: A | Discharge status: Home / Self Care | Payer: BLUE CROSS/BLUE SHIELD | Source: Ambulatory Visit | Attending: Obstetrics and Gynecology | Admitting: Obstetrics and Gynecology

## 2017-01-30 DIAGNOSIS — Z3A11 11 weeks gestation of pregnancy: Secondary | ICD-10-CM | POA: Diagnosis not present

## 2017-01-30 DIAGNOSIS — D259 Leiomyoma of uterus, unspecified: Secondary | ICD-10-CM | POA: Diagnosis not present

## 2017-01-30 DIAGNOSIS — O3411 Maternal care for benign tumor of corpus uteri, first trimester: Secondary | ICD-10-CM | POA: Insufficient documentation

## 2017-01-30 DIAGNOSIS — IMO0002 Reserved for concepts with insufficient information to code with codable children: Secondary | ICD-10-CM

## 2017-07-29 ENCOUNTER — Encounter (HOSPITAL_COMMUNITY): Payer: Self-pay | Admitting: *Deleted

## 2017-07-31 ENCOUNTER — Telehealth (HOSPITAL_COMMUNITY): Payer: Self-pay | Admitting: *Deleted

## 2017-07-31 NOTE — Telephone Encounter (Signed)
Preadmission screen  

## 2017-08-04 ENCOUNTER — Encounter (HOSPITAL_COMMUNITY): Payer: Self-pay

## 2017-08-11 ENCOUNTER — Other Ambulatory Visit: Payer: Self-pay | Admitting: Obstetrics and Gynecology

## 2017-08-11 NOTE — Patient Instructions (Signed)
Jessica Barker  08/11/2017   Your procedure is scheduled on:  08/13/2017  Enter through the Main Entrance of [facility] at [place] up the phone at the desk and dial [phone]  Call this number if you have problems the morning of surgery:[phone]  Remember:   Do not eat food:(After Midnight) Desps de medianoche.  Do not drink clear liquids: (After Midnight) Desps de medianoche.  Take these medicines the morning of surgery with A SIP OF WATER: none   Do not wear jewelry, make-up or nail polish.  Do not wear lotions, powders, or perfumes. Do not wear deodorant.  Do not shave 48 hours prior to surgery.  Do not bring valuables to the hospital.  [facility] is not   responsible for any belongings or valuables brought to the hospital.  Contacts, dentures or bridgework may not be worn into surgery.  Leave suitcase in the car. After surgery it may be brought to your room.  For patients admitted to the hospital, checkout time is 11:00 AM the day of              discharge.    N/A   Please read over the following fact sheets that you were given:   Surgical Site Infection Prevention

## 2017-08-12 ENCOUNTER — Encounter (HOSPITAL_COMMUNITY)
Admission: RE | Admit: 2017-08-12 | Discharge: 2017-08-12 | Disposition: A | Discharge status: Home / Self Care | Payer: BLUE CROSS/BLUE SHIELD | Source: Ambulatory Visit | Attending: Obstetrics and Gynecology | Admitting: Obstetrics and Gynecology

## 2017-08-12 ENCOUNTER — Other Ambulatory Visit: Payer: Self-pay | Admitting: Obstetrics and Gynecology

## 2017-08-12 LAB — CBC
HCT: 38.8 % (ref 36.0–46.0)
Hemoglobin: 13 g/dL (ref 12.0–15.0)
MCH: 31.7 pg (ref 26.0–34.0)
MCHC: 33.5 g/dL (ref 30.0–36.0)
MCV: 94.6 fL (ref 78.0–100.0)
PLATELETS: 226 10*3/uL (ref 150–400)
RBC: 4.1 MIL/uL (ref 3.87–5.11)
RDW: 13.4 % (ref 11.5–15.5)
WBC: 8.1 10*3/uL (ref 4.0–10.5)

## 2017-08-12 LAB — TYPE AND SCREEN
ABO/RH(D): A POS
ANTIBODY SCREEN: NEGATIVE

## 2017-08-12 NOTE — Anesthesia Preprocedure Evaluation (Addendum)
Anesthesia Evaluation  Patient identified by MRN, date of birth, ID band Patient awake    Reviewed: Allergy & Precautions, NPO status , Patient's Chart, lab work & pertinent test results  Airway Mallampati: II  TM Distance: >3 FB Neck ROM: Full    Dental no notable dental hx.    Pulmonary neg pulmonary ROS,    Pulmonary exam normal breath sounds clear to auscultation       Cardiovascular negative cardio ROS Normal cardiovascular exam Rhythm:Regular Rate:Normal     Neuro/Psych negative neurological ROS  negative psych ROS   GI/Hepatic negative GI ROS, Neg liver ROS,   Endo/Other  Morbid obesity  Renal/GU negative Renal ROS     Musculoskeletal negative musculoskeletal ROS (+)   Abdominal (+) + obese,   Peds  Hematology negative hematology ROS (+)   Anesthesia Other Findings  H/O Cesarean Section  Reproductive/Obstetrics                            Anesthesia Physical Anesthesia Plan  ASA: III  Anesthesia Plan: Spinal   Post-op Pain Management:    Induction: Intravenous  PONV Risk Score and Plan: 2 and Scopolamine patch - Pre-op, Ondansetron and Treatment may vary due to age or medical condition  Airway Management Planned: Natural Airway  Additional Equipment:   Intra-op Plan:   Post-operative Plan:   Informed Consent: I have reviewed the patients History and Physical, chart, labs and discussed the procedure including the risks, benefits and alternatives for the proposed anesthesia with the patient or authorized representative who has indicated his/her understanding and acceptance.   Dental advisory given  Plan Discussed with: CRNA  Anesthesia Plan Comments:         Anesthesia Quick Evaluation

## 2017-08-12 NOTE — H&P (Signed)
Jessica Barker is a 32 y.o. female presenting for repeat cesarean section at 39 wks and 1 day. Pregnancy has been uncomplicated. Prenatal care provided by Dr. Christophe Louis with [facility].    OB History    Gravida Para Term Preterm AB Living   3 1 1   1 1    SAB TAB Ectopic Multiple Live Births   1       1     Past Medical History:  Diagnosis Date  . H/O varicella    Past Surgical History:  Procedure Laterality Date  . CESAREAN SECTION  04/21/2012   Procedure: CESAREAN SECTION;  Surgeon: Maeola Sarah. Landry Mellow, MD;  Location: [facility];  Service: Obstetrics;  Laterality: N/A;  . FOOT SURGERY     Left foot   Family History: family history includes Asthma in her son; Cancer in her cousin, maternal aunt, maternal grandfather, and mother; Hypertension in her father, mother, paternal grandfather, and paternal grandmother; Lung cancer in her father; Stroke in her maternal grandfather. Social History:  reports that  has never smoked. she has never used smokeless tobacco. She reports that she does not drink alcohol. Her drug history is not on file.     Maternal Diabetes: No Genetic Screening: Normal Maternal Ultrasounds/Referrals: Normal Fetal Ultrasounds or other Referrals:  None Maternal Substance Abuse:  No Significant Maternal Medications:  None Significant Maternal Lab Results:  Lab values include: Group B Strep negative Other Comments:  None  Review of Systems  Constitutional: Negative.   HENT: Negative.   Eyes: Negative.   Respiratory: Negative.   Cardiovascular: Negative.   Gastrointestinal: Negative.   Genitourinary: Negative.   Musculoskeletal: Negative.   Skin: Negative.   Neurological: Negative.   Endo/Heme/Allergies: Negative.   Psychiatric/Behavioral: Negative.    History   Last menstrual period 11/13/2016, unknown if currently breastfeeding. Exam Physical Exam  Vitals reviewed. Constitutional: She is oriented to person, place, and time. She appears well-developed and  well-nourished.  HENT:  Head: Normocephalic and atraumatic.  Eyes: Conjunctivae are normal. Pupils are equal, round, and reactive to light.  Neck: Normal range of motion. Neck supple.  Cardiovascular: Normal rate and regular rhythm.  Respiratory: Effort normal and breath sounds normal.  GI: There is no tenderness.  Genitourinary: Vagina normal.  Musculoskeletal: Normal range of motion. She exhibits edema.  Neurological: She is alert and oriented to person, place, and time. She has normal reflexes.  Skin: Skin is warm and dry.  Psychiatric: She has a normal mood and affect.    Prenatal labs: ABO, Rh: --/--/A POS (03/12 1042) Antibody: NEG (03/12 1042) Rubella:   RPR: Nonreactive (07/27 0000)  HBsAg: Negative (07/27 0000)  HIV: Non-reactive (07/27 0000)  GBS:   Negative  Assessment/Plan: 39 wks and 0 days for repeat cesarean section. .. R/b/a of cesarean section discussed with the patient including but not limited to infection, bleeding damage to bowel bladder baby with the need for further surgery. R/o transfusion discussed HIV/ hepB&C discussed. Pt voiced understanding and desires to proceed.    Kina Shiffman J. 08/12/2017, 11:49 PM

## 2017-08-13 ENCOUNTER — Other Ambulatory Visit: Payer: Self-pay

## 2017-08-13 ENCOUNTER — Inpatient Hospital Stay (HOSPITAL_COMMUNITY): Payer: BLUE CROSS/BLUE SHIELD | Admitting: Anesthesiology

## 2017-08-13 ENCOUNTER — Encounter (HOSPITAL_COMMUNITY)
Admission: AD | Disposition: A | Discharge status: Home / Self Care | Payer: Self-pay | Source: Ambulatory Visit | Attending: Obstetrics and Gynecology

## 2017-08-13 ENCOUNTER — Encounter (HOSPITAL_COMMUNITY): Payer: Self-pay

## 2017-08-13 ENCOUNTER — Inpatient Hospital Stay (HOSPITAL_COMMUNITY)
Admission: AD | Admit: 2017-08-13 | Discharge: 2017-08-16 | DRG: 788 | Disposition: A | Discharge status: Home / Self Care | Payer: BLUE CROSS/BLUE SHIELD | Source: Ambulatory Visit | Attending: Obstetrics and Gynecology | Admitting: Obstetrics and Gynecology

## 2017-08-13 DIAGNOSIS — O34211 Maternal care for low transverse scar from previous cesarean delivery: Principal | ICD-10-CM | POA: Diagnosis present

## 2017-08-13 DIAGNOSIS — O99214 Obesity complicating childbirth: Secondary | ICD-10-CM | POA: Diagnosis present

## 2017-08-13 DIAGNOSIS — D250 Submucous leiomyoma of uterus: Secondary | ICD-10-CM | POA: Diagnosis present

## 2017-08-13 DIAGNOSIS — O3413 Maternal care for benign tumor of corpus uteri, third trimester: Secondary | ICD-10-CM | POA: Diagnosis present

## 2017-08-13 DIAGNOSIS — Z3A39 39 weeks gestation of pregnancy: Secondary | ICD-10-CM | POA: Diagnosis not present

## 2017-08-13 DIAGNOSIS — Z98891 History of uterine scar from previous surgery: Secondary | ICD-10-CM

## 2017-08-13 LAB — RPR: RPR: NONREACTIVE

## 2017-08-13 SURGERY — Surgical Case
Anesthesia: Spinal

## 2017-08-13 MED ORDER — ACETAMINOPHEN 10 MG/ML IV SOLN: 1000.0000 mg | Freq: Once | INTRAVENOUS | Status: DC | PRN

## 2017-08-13 MED ORDER — METHYLERGONOVINE MALEATE 0.2 MG/ML IJ SOLN: 0.2000 mg | INTRAMUSCULAR | Status: DC | PRN

## 2017-08-13 MED ORDER — SIMETHICONE 80 MG PO CHEW: 80.0000 mg | CHEWABLE_TABLET | ORAL | Status: DC | PRN

## 2017-08-13 MED ORDER — OXYTOCIN 10 UNIT/ML IJ SOLN
INTRAMUSCULAR | Status: AC
  Filled 2017-08-13: qty 4

## 2017-08-13 MED ORDER — FENTANYL CITRATE (PF) 100 MCG/2ML IJ SOLN
INTRAMUSCULAR | Status: DC | PRN
  Administered 2017-08-13: 10 ug via INTRATHECAL

## 2017-08-13 MED ORDER — SODIUM CHLORIDE 0.9 % IR SOLN
Status: DC | PRN
  Administered 2017-08-13: 1

## 2017-08-13 MED ORDER — SCOPOLAMINE 1 MG/3DAYS TD PT72
MEDICATED_PATCH | TRANSDERMAL | Status: DC | PRN
  Administered 2017-08-13: 1 via TRANSDERMAL

## 2017-08-13 MED ORDER — WITCH HAZEL-GLYCERIN EX PADS: 1.0000 "application " | MEDICATED_PAD | CUTANEOUS | Status: DC | PRN

## 2017-08-13 MED ORDER — COCONUT OIL OIL: 1.0000 "application " | TOPICAL_OIL | Status: DC | PRN

## 2017-08-13 MED ORDER — PHENYLEPHRINE HCL 10 MG/ML IJ SOLN
INTRAMUSCULAR | Status: DC | PRN
  Administered 2017-08-13: 40 ug via INTRAVENOUS

## 2017-08-13 MED ORDER — DEXAMETHASONE SODIUM PHOSPHATE 4 MG/ML IJ SOLN
INTRAMUSCULAR | Status: DC | PRN
  Administered 2017-08-13: 4 mg via INTRAVENOUS

## 2017-08-13 MED ORDER — SOD CITRATE-CITRIC ACID 500-334 MG/5ML PO SOLN
30.0000 mL | ORAL | Status: AC
  Administered 2017-08-13: 30 mL via ORAL

## 2017-08-13 MED ORDER — LACTATED RINGERS IV SOLN
INTRAVENOUS | Status: DC | PRN
  Administered 2017-08-13: 09:00:00 via INTRAVENOUS

## 2017-08-13 MED ORDER — DIBUCAINE 1 % RE OINT: 1.0000 "application " | TOPICAL_OINTMENT | RECTAL | Status: DC | PRN

## 2017-08-13 MED ORDER — ONDANSETRON HCL 4 MG/2ML IJ SOLN
INTRAMUSCULAR | Status: DC | PRN
  Administered 2017-08-13: 4 mg via INTRAVENOUS

## 2017-08-13 MED ORDER — HYDROMORPHONE HCL 1 MG/ML IJ SOLN: 0.2500 mg | INTRAMUSCULAR | Status: DC | PRN

## 2017-08-13 MED ORDER — ONDANSETRON HCL 4 MG/2ML IJ SOLN
INTRAMUSCULAR | Status: AC
  Filled 2017-08-13: qty 2

## 2017-08-13 MED ORDER — OXYTOCIN 10 UNIT/ML IJ SOLN
INTRAMUSCULAR | Status: DC | PRN
  Administered 2017-08-13: 40 [IU] via INTRAVENOUS

## 2017-08-13 MED ORDER — MORPHINE SULFATE (PF) 0.5 MG/ML IJ SOLN
INTRAMUSCULAR | Status: AC
  Filled 2017-08-13: qty 10

## 2017-08-13 MED ORDER — FERROUS SULFATE 325 (65 FE) MG PO TABS
325.0000 mg | ORAL_TABLET | Freq: Two times a day (BID) | ORAL | Status: DC
  Administered 2017-08-13 – 2017-08-16 (×6): 325 mg via ORAL
  Filled 2017-08-13 (×6): qty 1

## 2017-08-13 MED ORDER — MENTHOL 3 MG MT LOZG: 1.0000 | LOZENGE | OROMUCOSAL | Status: DC | PRN

## 2017-08-13 MED ORDER — BUPIVACAINE IN DEXTROSE 0.75-8.25 % IT SOLN
INTRATHECAL | Status: DC | PRN
  Administered 2017-08-13: 1.4 mL via INTRATHECAL

## 2017-08-13 MED ORDER — SIMETHICONE 80 MG PO CHEW
80.0000 mg | CHEWABLE_TABLET | Freq: Three times a day (TID) | ORAL | Status: DC
  Administered 2017-08-13 – 2017-08-16 (×9): 80 mg via ORAL
  Filled 2017-08-13 (×9): qty 1

## 2017-08-13 MED ORDER — BUPIVACAINE IN DEXTROSE 0.75-8.25 % IT SOLN
INTRATHECAL | Status: AC
  Filled 2017-08-13: qty 2

## 2017-08-13 MED ORDER — OXYTOCIN 40 UNITS IN LACTATED RINGERS INFUSION - SIMPLE MED: 2.5000 [IU]/h | INTRAVENOUS | Status: AC

## 2017-08-13 MED ORDER — ZOLPIDEM TARTRATE 5 MG PO TABS: 5.0000 mg | ORAL_TABLET | Freq: Every evening | ORAL | Status: DC | PRN

## 2017-08-13 MED ORDER — ACETAMINOPHEN 325 MG PO TABS: 650.0000 mg | ORAL_TABLET | ORAL | Status: DC | PRN

## 2017-08-13 MED ORDER — LACTATED RINGERS IV SOLN
INTRAVENOUS | Status: DC | PRN
  Administered 2017-08-13 (×3): via INTRAVENOUS

## 2017-08-13 MED ORDER — METOCLOPRAMIDE HCL 5 MG/ML IJ SOLN
INTRAMUSCULAR | Status: AC
  Filled 2017-08-13: qty 2

## 2017-08-13 MED ORDER — SCOPOLAMINE 1 MG/3DAYS TD PT72
MEDICATED_PATCH | TRANSDERMAL | Status: AC
  Filled 2017-08-13: qty 1

## 2017-08-13 MED ORDER — METOCLOPRAMIDE HCL 5 MG/ML IJ SOLN
INTRAMUSCULAR | Status: DC | PRN
  Administered 2017-08-13: 10 mg via INTRAVENOUS

## 2017-08-13 MED ORDER — DIPHENHYDRAMINE HCL 25 MG PO CAPS: 25.0000 mg | ORAL_CAPSULE | Freq: Four times a day (QID) | ORAL | Status: DC | PRN

## 2017-08-13 MED ORDER — METHYLERGONOVINE MALEATE 0.2 MG PO TABS: 0.2000 mg | ORAL_TABLET | ORAL | Status: DC | PRN

## 2017-08-13 MED ORDER — LACTATED RINGERS IV SOLN: INTRAVENOUS | Status: DC

## 2017-08-13 MED ORDER — SOD CITRATE-CITRIC ACID 500-334 MG/5ML PO SOLN
ORAL | Status: AC
  Filled 2017-08-13: qty 15

## 2017-08-13 MED ORDER — PHENYLEPHRINE 8 MG IN D5W 100 ML (0.08MG/ML) PREMIX OPTIME
INJECTION | INTRAVENOUS | Status: AC
  Filled 2017-08-13: qty 100

## 2017-08-13 MED ORDER — OXYCODONE-ACETAMINOPHEN 5-325 MG PO TABS: 2.0000 | ORAL_TABLET | ORAL | Status: DC | PRN

## 2017-08-13 MED ORDER — FENTANYL CITRATE (PF) 100 MCG/2ML IJ SOLN
INTRAMUSCULAR | Status: AC
  Filled 2017-08-13: qty 2

## 2017-08-13 MED ORDER — MORPHINE SULFATE (PF) 0.5 MG/ML IJ SOLN
INTRAMUSCULAR | Status: DC | PRN
  Administered 2017-08-13: 200 ug via INTRATHECAL

## 2017-08-13 MED ORDER — OXYCODONE HCL 5 MG PO TABS
ORAL_TABLET | ORAL | Status: AC
  Filled 2017-08-13: qty 1

## 2017-08-13 MED ORDER — DEXAMETHASONE SODIUM PHOSPHATE 4 MG/ML IJ SOLN
INTRAMUSCULAR | Status: AC
  Filled 2017-08-13: qty 1

## 2017-08-13 MED ORDER — PHENYLEPHRINE 40 MCG/ML (10ML) SYRINGE FOR IV PUSH (FOR BLOOD PRESSURE SUPPORT)
PREFILLED_SYRINGE | INTRAVENOUS | Status: AC
  Filled 2017-08-13: qty 10

## 2017-08-13 MED ORDER — OXYCODONE HCL 5 MG PO TABS
5.0000 mg | ORAL_TABLET | Freq: Once | ORAL | Status: AC | PRN
  Administered 2017-08-13: 5 mg via ORAL

## 2017-08-13 MED ORDER — IBUPROFEN 600 MG PO TABS
600.0000 mg | ORAL_TABLET | Freq: Four times a day (QID) | ORAL | Status: DC
  Administered 2017-08-13 – 2017-08-16 (×13): 600 mg via ORAL
  Filled 2017-08-13 (×13): qty 1

## 2017-08-13 MED ORDER — SENNOSIDES-DOCUSATE SODIUM 8.6-50 MG PO TABS
2.0000 | ORAL_TABLET | ORAL | Status: DC
  Administered 2017-08-13 – 2017-08-16 (×3): 2 via ORAL
  Filled 2017-08-13 (×3): qty 2

## 2017-08-13 MED ORDER — CEFAZOLIN SODIUM-DEXTROSE 2-4 GM/100ML-% IV SOLN
2.0000 g | INTRAVENOUS | Status: AC
  Administered 2017-08-13: 2 g via INTRAVENOUS

## 2017-08-13 MED ORDER — SIMETHICONE 80 MG PO CHEW
80.0000 mg | CHEWABLE_TABLET | ORAL | Status: DC
  Administered 2017-08-13 – 2017-08-16 (×3): 80 mg via ORAL
  Filled 2017-08-13 (×3): qty 1

## 2017-08-13 MED ORDER — OXYCODONE HCL 5 MG/5ML PO SOLN: 5.0000 mg | Freq: Once | ORAL | Status: AC | PRN

## 2017-08-13 MED ORDER — PHENYLEPHRINE 8 MG IN D5W 100 ML (0.08MG/ML) PREMIX OPTIME
INJECTION | INTRAVENOUS | Status: DC | PRN
  Administered 2017-08-13: 60 ug/min via INTRAVENOUS

## 2017-08-13 MED ORDER — PRENATAL MULTIVITAMIN CH
1.0000 | ORAL_TABLET | Freq: Every day | ORAL | Status: DC
  Administered 2017-08-13 – 2017-08-16 (×4): 1 via ORAL
  Filled 2017-08-13 (×4): qty 1

## 2017-08-13 MED ORDER — PROMETHAZINE HCL 25 MG/ML IJ SOLN: 6.2500 mg | INTRAMUSCULAR | Status: DC | PRN

## 2017-08-13 MED ORDER — OXYCODONE-ACETAMINOPHEN 5-325 MG PO TABS: 1.0000 | ORAL_TABLET | ORAL | Status: DC | PRN

## 2017-08-13 MED ORDER — MEPERIDINE HCL 25 MG/ML IJ SOLN: 6.2500 mg | INTRAMUSCULAR | Status: DC | PRN

## 2017-08-13 SURGICAL SUPPLY — 43 items
BARRIER ADHS 3X4 INTERCEED (GAUZE/BANDAGES/DRESSINGS) ×2 IMPLANT
BENZOIN TINCTURE PRP APPL 2/3 (GAUZE/BANDAGES/DRESSINGS) ×2 IMPLANT
CHLORAPREP W/TINT 26ML (MISCELLANEOUS) ×2 IMPLANT
CLAMP CORD UMBIL (MISCELLANEOUS) IMPLANT
CLOSURE STERI STRIP 1/2 X4 (GAUZE/BANDAGES/DRESSINGS) ×2 IMPLANT
CLOTH BEACON ORANGE TIMEOUT ST (SAFETY) ×2 IMPLANT
DEVICE BLD TRNS LUER ATTCH (MISCELLANEOUS) ×2 IMPLANT
DRSG OPSITE POSTOP 4X10 (GAUZE/BANDAGES/DRESSINGS) ×2 IMPLANT
ELECT REM PT RETURN 9FT ADLT (ELECTROSURGICAL) ×2
ELECTRODE REM PT RTRN 9FT ADLT (ELECTROSURGICAL) ×1 IMPLANT
EXTRACTOR VACUUM KIWI (MISCELLANEOUS) ×2 IMPLANT
GAUZE SPONGE 4X4 12PLY STRL LF (GAUZE/BANDAGES/DRESSINGS) ×4 IMPLANT
GLOVE BIO SURGEON STRL SZ 6.5 (GLOVE) ×2 IMPLANT
GLOVE BIOGEL M 6.5 STRL (GLOVE) ×6 IMPLANT
GLOVE BIOGEL PI IND STRL 6.5 (GLOVE) ×2 IMPLANT
GLOVE BIOGEL PI IND STRL 7.0 (GLOVE) ×1 IMPLANT
GLOVE BIOGEL PI INDICATOR 6.5 (GLOVE) ×2
GLOVE BIOGEL PI INDICATOR 7.0 (GLOVE) ×1
GOWN STRL REUS W/TWL LRG LVL3 (GOWN DISPOSABLE) ×6 IMPLANT
KIT ABG SYR 3ML LUER SLIP (SYRINGE) IMPLANT
NEEDLE HYPO 25X5/8 SAFETYGLIDE (NEEDLE) IMPLANT
NS IRRIG 1000ML POUR BTL (IV SOLUTION) ×2 IMPLANT
PACK C SECTION WH (CUSTOM PROCEDURE TRAY) ×2 IMPLANT
PAD ABD 7.5X8 STRL (GAUZE/BANDAGES/DRESSINGS) ×2 IMPLANT
PAD OB MATERNITY 4.3X12.25 (PERSONAL CARE ITEMS) ×2 IMPLANT
PENCIL SMOKE EVAC W/HOLSTER (ELECTROSURGICAL) ×2 IMPLANT
RETRACTOR TRAXI PANNICULUS (MISCELLANEOUS) ×1 IMPLANT
RTRCTR C-SECT PINK 25CM LRG (MISCELLANEOUS) ×2 IMPLANT
SPONGE LAP 18X18 RF (DISPOSABLE) ×6 IMPLANT
STRIP CLOSURE SKIN 1/2X4 (GAUZE/BANDAGES/DRESSINGS) ×2 IMPLANT
SUT PDS AB 0 CT1 27 (SUTURE) ×4 IMPLANT
SUT PLAIN 0 NONE (SUTURE) IMPLANT
SUT VIC AB 0 CTX 36 (SUTURE) ×4
SUT VIC AB 0 CTX36XBRD ANBCTRL (SUTURE) ×4 IMPLANT
SUT VIC AB 2-0 CT1 27 (SUTURE) ×1
SUT VIC AB 2-0 CT1 TAPERPNT 27 (SUTURE) ×1 IMPLANT
SUT VIC AB 3-0 SH 27 (SUTURE)
SUT VIC AB 3-0 SH 27X BRD (SUTURE) IMPLANT
SUT VIC AB 4-0 KS 27 (SUTURE) ×2 IMPLANT
TOWEL OR 17X24 6PK STRL BLUE (TOWEL DISPOSABLE) ×2 IMPLANT
TRAXI PANNICULUS RETRACTOR (MISCELLANEOUS) ×1
TRAY FOLEY BAG SILVER LF 14FR (SET/KITS/TRAYS/PACK) ×2 IMPLANT
WATER STERILE IRR 1000ML POUR (IV SOLUTION) ×2 IMPLANT

## 2017-08-13 NOTE — H&P (Signed)
Date of Initial H&P: 08/12/2017  History reviewed, patient examined, no change in status, stable for surgery.

## 2017-08-13 NOTE — Anesthesia Postprocedure Evaluation (Signed)
Anesthesia Post Note  Patient: Jessica Barker  Procedure(s) Performed: CESAREAN SECTION (N/A )     Patient location during evaluation: PACU Anesthesia Type: Spinal Level of consciousness: oriented and awake and alert Pain management: pain level controlled Vital Signs Assessment: post-procedure vital signs reviewed and stable Respiratory status: spontaneous breathing, respiratory function stable and patient connected to nasal cannula oxygen Cardiovascular status: blood pressure returned to baseline and stable Postop Assessment: no headache, no backache and no apparent nausea or vomiting Anesthetic complications: no    Last Vitals:  Vitals:   08/13/17 1340 08/13/17 1440  BP: 111/64 111/61  Pulse: (!) 59 63  Resp: 18 18  Temp: (!) 36.3 C (!) 36.3 C  SpO2: 97% 98%    Last Pain:  Vitals:   08/13/17 1440  TempSrc: Oral  PainSc:    Pain Goal:                 Karyl Kinnier Ellender

## 2017-08-13 NOTE — Op Note (Signed)
Cesarean Section Procedure Note  Indications: previous uterine incision low transverse cesarean section   Pre-operative Diagnosis: 39 week 0 day pregnancy.  Post-operative Diagnosis: same  Surgeon: Catha Brow.   Assistants:Dr. Janyth Pupa assisted due to concern for adhesive disease   Anesthesia: Spinal anesthesia  ASA Class: 2   Procedure Details   The patient was seen in the Holding Room. The risks, benefits, complications, treatment options, and expected outcomes were discussed with the patient.  The patient concurred with the proposed plan, giving informed consent.  The site of surgery properly noted/marked. The patient was taken to Operating Room # 9, identified as Jessica Barker and the procedure verified as C-Section Delivery. A Time Out was held and the above information confirmed.  After induction of anesthesia, the patient was draped and prepped in the usual sterile manner. A Pfannenstiel incision was made and carried down through the subcutaneous tissue to the fascia. Fascial incision was made and extended transversely. The fascia was separated from the underlying rectus tissue superiorly and inferiorly. The peritoneum was identified and entered. Peritoneal incision was extended longitudinally. The utero-vesical peritoneal reflection was incised transversely and the bladder flap was bluntly freed from the lower uterine segment. A low transverse uterine incision was made. Delivered  Using a Kiwi Vacuum from cephalic presentation was a  Female with Apgar scores of 8 at one minute and 9 at five minutes. After the umbilical cord was clamped and cut cord blood was obtained for evaluation. The placenta was removed intact and appeared normal. A submucosal fibroid 1.5 cm in size.  in the endometrium was noted and removed with the bovie. The uterine outline, tubes and ovaries appeared normal. The uterine incision was closed with running locked sutures of 0 vicryl. A second layer of 0 vicryl  was used to imbricate the incision .Marland Kitchen Hemostasis was observed. Lavage was carried out until clear.  Interseed was placed along the uterine incision. The peritoneum was closed with 2-0 vicryl. The fascia was then reapproximated with running sutures of 0 pds.. The skin was reapproximated with 4-0 vicryl..  Instrument, sponge, and needle counts were correct prior the abdominal closure and at the conclusion of the case.   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 Normal fallopian tubes and ovaries.. Nuchal cord x 1.   Estimated Blood Loss:  1100 mL         Drains: None         Total IV Fluids:  Per anesthesia ml         Specimens: Placenta and submucosal fibroid... the placenta was sent to labor and delivery the fibroid was sent to pathology .           Implants: None         Complications:  None; patient tolerated the procedure well.         Disposition: PACU - hemodynamically stable.         Condition: stable  Attending Attestation: I performed the procedure.

## 2017-08-13 NOTE — Addendum Note (Signed)
Addendum  created 08/13/17 1817 by Asher Muir, CRNA   Sign clinical note

## 2017-08-13 NOTE — Transfer of Care (Signed)
Immediate Anesthesia Transfer of Care Note  Patient: Jessica Barker  Procedure(s) Performed: CESAREAN SECTION (N/A )  Patient Location: PACU  Anesthesia Type:Spinal  Level of Consciousness: awake, alert  and oriented  Airway & Oxygen Therapy: Patient Spontanous Breathing  Post-op Assessment: Report given to RN and Post -op Vital signs reviewed and stable  Post vital signs: Reviewed and stable  Last Vitals:  Vitals:   08/13/17 0700  BP: 109/68  Pulse: 88  Resp: 16  Temp: 36.9 C  SpO2: 100%    Last Pain:  Vitals:   08/13/17 0700  TempSrc: Oral         Complications: No apparent anesthesia complications

## 2017-08-13 NOTE — Anesthesia Procedure Notes (Signed)
Spinal  Patient location during procedure: OR Start time: 08/13/2017 8:20 AM End time: 08/13/2017 8:30 AM Staffing Anesthesiologist: Murvin Natal, MD Performed: anesthesiologist  Preanesthetic Checklist Completed: patient identified, surgical consent, pre-op evaluation, timeout performed, IV checked, risks and benefits discussed and monitors and equipment checked Spinal Block Patient position: sitting Prep: DuraPrep Patient monitoring: cardiac monitor, continuous pulse ox and blood pressure Approach: midline Location: L3-4 Injection technique: single-shot Needle Needle type: Pencan  Needle gauge: 24 G Needle length: 9 cm Assessment Sensory level: T10 Additional Notes Functioning IV was confirmed and monitors were applied. Sterile prep and drape, including hand hygiene and sterile gloves were used. The patient was positioned and the spine was prepped. The skin was anesthetized with lidocaine.  Free flow of clear CSF was obtained prior to injecting local anesthetic into the CSF.  The spinal needle aspirated freely following injection.  The needle was carefully withdrawn.  The patient tolerated the procedure well.

## 2017-08-13 NOTE — Anesthesia Postprocedure Evaluation (Signed)
Anesthesia Post Note  Patient: Jessica Barker  Procedure(s) Performed: CESAREAN SECTION (N/A )     Patient location during evaluation: Mother Baby Anesthesia Type: Spinal Level of consciousness: awake Pain management: satisfactory to patient Vital Signs Assessment: post-procedure vital signs reviewed and stable Respiratory status: spontaneous breathing Cardiovascular status: stable Anesthetic complications: no    Last Vitals:  Vitals:   08/13/17 1440 08/13/17 1700  BP: 111/61 105/60  Pulse: 63 68  Resp: 18 18  Temp: (!) 36.3 C 36.7 C  SpO2: 98%     Last Pain:  Vitals:   08/13/17 1800  TempSrc:   PainSc: 3    Pain Goal: Patients Stated Pain Goal: 3 (08/13/17 1800)               [name]

## 2017-08-14 LAB — CBC
HEMATOCRIT: 32.5 % — AB (ref 36.0–46.0)
HEMOGLOBIN: 10.9 g/dL — AB (ref 12.0–15.0)
MCH: 31.2 pg (ref 26.0–34.0)
MCHC: 33.5 g/dL (ref 30.0–36.0)
MCV: 93.1 fL (ref 78.0–100.0)
Platelets: 188 10*3/uL (ref 150–400)
RBC: 3.49 MIL/uL — AB (ref 3.87–5.11)
RDW: 13.5 % (ref 11.5–15.5)
WBC: 13.4 10*3/uL — ABNORMAL HIGH (ref 4.0–10.5)

## 2017-08-14 LAB — BIRTH TISSUE RECOVERY COLLECTION (PLACENTA DONATION)

## 2017-08-14 NOTE — Progress Notes (Signed)
Subjective: Postop Day 1: Cesarean Delivery No complaints.  Pain controlled.  Lochia normal.  Breast feeding -Pumping only.  Pt has ambulated in halls.  Desires discharge tomorrow.  Objective: Temp:  [97.4 F (36.3 C)-98.1 F (36.7 C)] 97.9 F (36.6 C) (03/14 0548) Pulse Rate:  [59-72] 72 (03/14 0548) Resp:  [18-19] 18 (03/14 0548) BP: (98-111)/(49-64) 98/49 (03/14 0548) SpO2:  [97 %-99 %] 99 % (03/14 0548)  Physical Exam: Gen: NAD Lochia: Not visualized Uterine Fundus: firm, appropriately tender Incision: Honeycomb dressing 50 % soiled.  Appears old. DVT Evaluation: + Edema present, no calf tenderness bilaterally   Recent Labs    08/12/17 1040 08/14/17 0542  HGB 13.0 10.9*  HCT 38.8 32.5*    Assessment/Plan: Status post C-section-doing well postoperatively. Routine post op care. Encouraged ambulation in halls. Lactation consult. Anticipate discharge in am.   Thurnell Lose 08/14/2017, 1:04 PM

## 2017-08-15 MED ORDER — IBUPROFEN 600 MG PO TABS: 600.0000 mg | ORAL_TABLET | Freq: Four times a day (QID) | ORAL | 1 refills | Status: AC | PRN

## 2017-08-15 NOTE — Progress Notes (Signed)
Subjective: Postpartum Day 2: Cesarean Delivery Patient reports tolerating PO, + flatus and no problems voiding.    Objective: Vital signs in last 24 hours: Temp:  [98.1 F (36.7 C)-98.6 F (37 C)] 98.6 F (37 C) (03/15 1808) Pulse Rate:  [84-92] 92 (03/15 1808) Resp:  [16-19] 16 (03/15 1808) BP: (99-117)/(55-62) 117/62 (03/15 1808) SpO2:  [98 %] 98 % (03/15 1808)  Physical Exam:  General: alert, cooperative and no distress Lochia: appropriate Uterine Fundus: firm Incision: bandage stained with blood however no active bleeding  DVT Evaluation: No evidence of DVT seen on physical exam.  Recent Labs    08/14/17 0542  HGB 10.9*  HCT 32.5*    Assessment/Plan: Status post Cesarean section. Doing well postoperatively.  Continue current care Change honeycomb bandage.  Routine postpartum care Discharge home tomorrow morning. Catha Brow. 08/15/2017, 8:22 PM

## 2017-08-15 NOTE — Care Management (Signed)
I came into bedside reporting to find that Jessica Barker and her husband were very upset by a comment they heard when they called the desk for a bottle for the baby.  They heard,"are they breast or bottle? They've called for 4 bottles in the last 5 hours" and they felt they were chastised and judged by their feeding method, as they are pumping and supplementing at this time. I apologized that this had happened to them and explained about baby friendly and our reasons for keeping track as we do. I gave them a sleeve of formula for this feeding so they would not have to call again soon and apologized again assuring them this should not have happened and it would not happen again. Lelon Frohlich, RN

## 2017-08-16 MED ORDER — OXYCODONE-ACETAMINOPHEN 5-325 MG PO TABS: 1.0000 | ORAL_TABLET | ORAL | 0 refills | Status: AC | PRN

## 2017-08-16 MED ORDER — ONDANSETRON HCL 4 MG PO TABS
4.0000 mg | ORAL_TABLET | Freq: Once | ORAL | Status: AC
  Administered 2017-08-16: 4 mg via ORAL
  Filled 2017-08-16: qty 1

## 2017-08-16 NOTE — Discharge Summary (Signed)
OB Discharge Summary     Patient Name: Jessica Barker DOB: [birth year] MRN: [id number]  Date of admission: 08/13/2017 Delivering MD: Christophe Louis   Date of discharge: 08/16/2017  Admitting diagnosis: Z98.891 H O Cesarean Section Intrauterine pregnancy: [redacted]w[redacted]d     Secondary diagnosis:  Active Problems:   S/P cesarean section  Additional problems: None     Discharge diagnosis: Term Pregnancy Delivered                                                                                                Post partum procedures:None  Augmentation: n/a  Complications: None  Hospital course:  Sceduled C/S   32 y.o. yo [id number] at [redacted]w[redacted]d was admitted to the hospital 08/13/2017 for scheduled cesarean section with the following indication:Elective Repeat.  Membrane Rupture Time/Date: 8:51 AM ,08/13/2017   Patient delivered a Viable infant.08/13/2017  Details of operation can be found in separate operative note.  Pateint had an uncomplicated postpartum course.  She is ambulating, tolerating a regular diet, passing flatus, and urinating well. Patient is discharged home in stable condition on  08/16/17         Physical exam  Vitals:   08/14/17 1747 08/15/17 0536 08/15/17 1808 08/16/17 0500  BP: 108/79 (!) 99/55 117/62 124/67  Pulse: 86 84 92 82  Resp: 18 19 16 18   Temp: 98.4 F (36.9 C) 98.1 F (36.7 C) 98.6 F (37 C) 97.7 F (36.5 C)  TempSrc: Oral Oral Tympanic Oral  SpO2:   98%   Weight:      Height:       General: alert, cooperative and no distress Lochia: appropriate Uterine Fundus: firm Incision: Dressing is clean, dry, and intact DVT Evaluation: No evidence of DVT seen on physical exam. Calf/Ankle edema is present Labs: Lab Results  Component Value Date   WBC 13.4 (H) 08/14/2017   HGB 10.9 (L) 08/14/2017   HCT 32.5 (L) 08/14/2017   MCV 93.1 08/14/2017   PLT 188 08/14/2017   No flowsheet data found.  Discharge instruction: per After Visit Summary and "Baby and Me  Booklet".  After visit meds:  Allergies as of 08/16/2017   No Known Allergies     Medication List    TAKE these medications   docusate sodium 100 MG capsule Commonly known as:  COLACE Take 100 mg by mouth daily as needed for mild constipation.   ibuprofen 600 MG tablet Commonly known as:  ADVIL,MOTRIN Take 1 tablet (600 mg total) by mouth every 6 (six) hours as needed.   oxyCODONE-acetaminophen 5-325 MG tablet Commonly known as:  PERCOCET/ROXICET Take 1 tablet by mouth every 4 (four) hours as needed (pain scale 4-7).   prenatal multivitamin Tabs tablet Take 1 tablet by mouth daily.       Diet: routine diet  Activity: Advance as tolerated. Pelvic rest for 6 weeks.   Outpatient follow up:2 weeks Follow up Appt:No future appointments. Follow up Visit:No Follow-up on file.  Postpartum contraception: IUD to be decided  Newborn Data: Live born female  Birth Weight: 7 lb 10.8 oz (3480 g) APGAR: 8,  91  Newborn Delivery   Birth date/time:  08/13/2017 08:52:00 Delivery type:  C-Section, Vacuum Assisted C-section categorization:  Repeat     Baby Feeding: Breast Disposition:home with mother   08/16/2017 Thurnell Lose, MD

## 2017-08-16 NOTE — Lactation Note (Signed)
This note was copied from a baby's chart. Lactation Consultation Note  Patient Name: Jessica Barker [id number] Date: 08/16/2017  Mom has decided to pump and bottle feed.  Pumped this AM and obtained 30+ mls.  Reviewed pumping basics and questions answered.  Breastfeeding consultation services and support information given and reviewed.   Maternal Data    Feeding    LATCH Score                   Interventions    Lactation Tools Discussed/Used     Consult Status      Ave Filter 08/16/2017, 12:05 PM

## 2017-08-16 NOTE — Discharge Instructions (Signed)

## 2018-03-24 ENCOUNTER — Other Ambulatory Visit (HOSPITAL_COMMUNITY)
Admission: RE | Admit: 2018-03-24 | Discharge: 2018-03-24 | Disposition: A | Discharge status: Home / Self Care | Payer: BLUE CROSS/BLUE SHIELD | Source: Ambulatory Visit | Attending: Obstetrics and Gynecology | Admitting: Obstetrics and Gynecology

## 2018-03-24 ENCOUNTER — Other Ambulatory Visit: Payer: Self-pay | Admitting: Obstetrics and Gynecology

## 2018-03-24 DIAGNOSIS — Z01411 Encounter for gynecological examination (general) (routine) with abnormal findings: Secondary | ICD-10-CM | POA: Diagnosis not present

## 2018-03-31 LAB — CYTOLOGY - PAP
Diagnosis: NEGATIVE
HPV (WINDOPATH): NOT DETECTED

## 2018-11-23 IMAGING — US US OB COMP LESS 14 WK
1 series · 15 of 28 positions shown · non-contrast
Comparison: None.

CLINICAL DATA: Evaluate viability. Fetal heart tones not heard in
clinic.

EXAM:
OBSTETRIC <14 WK ULTRASOUND
TECHNIQUE: Transabdominal ultrasound was performed for evaluation of the
gestation as well as the maternal uterus and adnexal regions.

[Series 1: us ob comp less 14 wk · 15 of 36 slices shown]
[im 1/36]
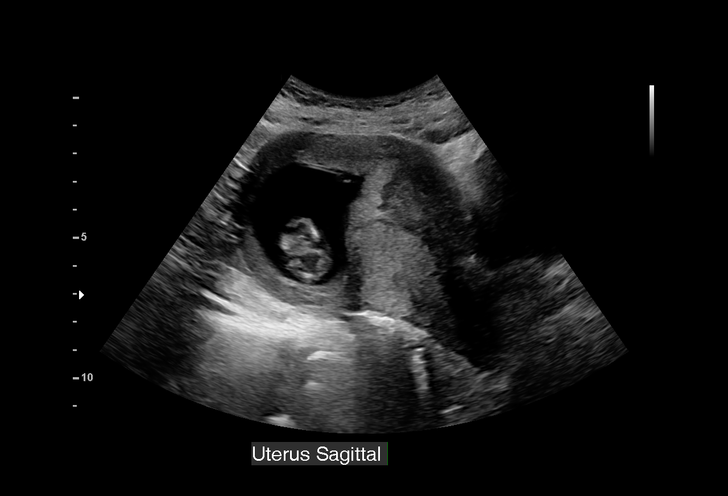
[im 3/36]
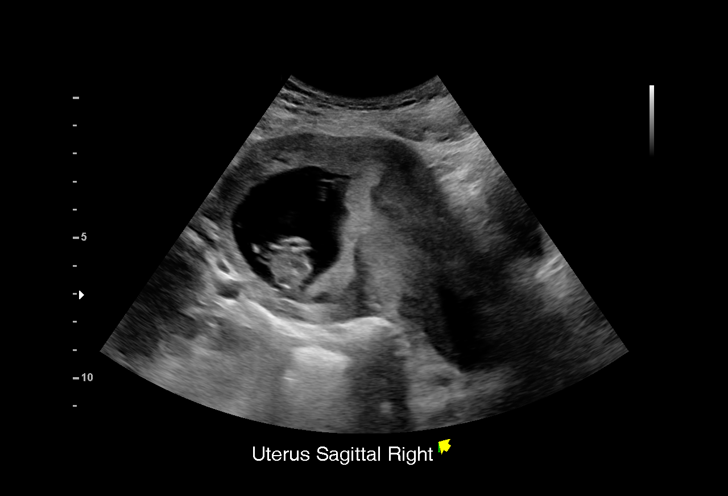
[im 6/36]
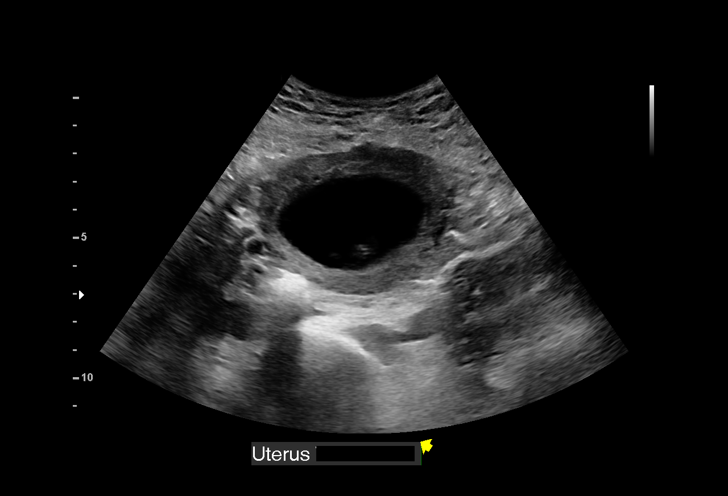
[im 8/36]
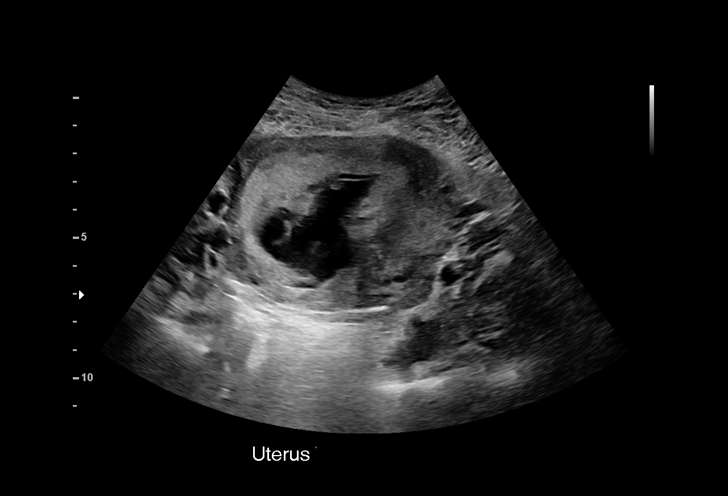
[im 11/36]
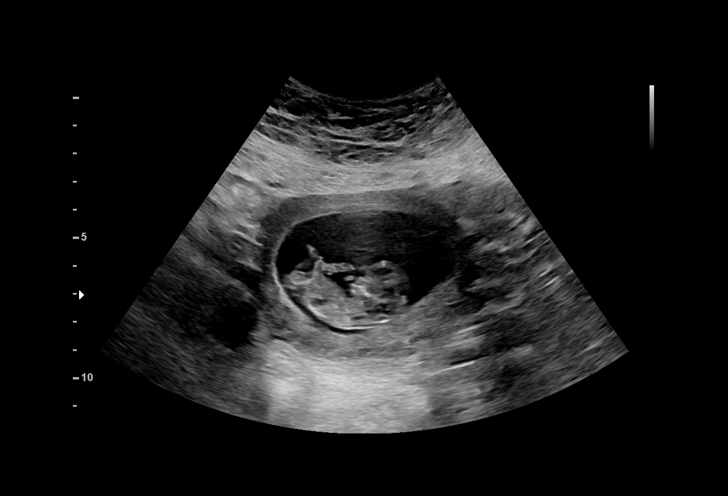
[im 13/36]
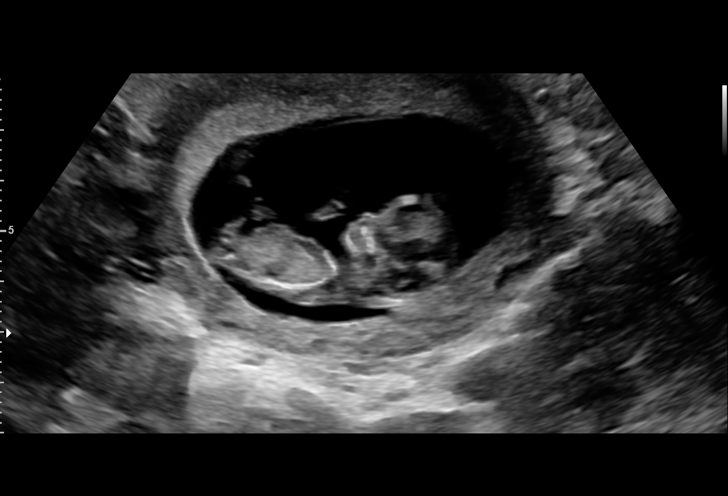
[im 16/36]
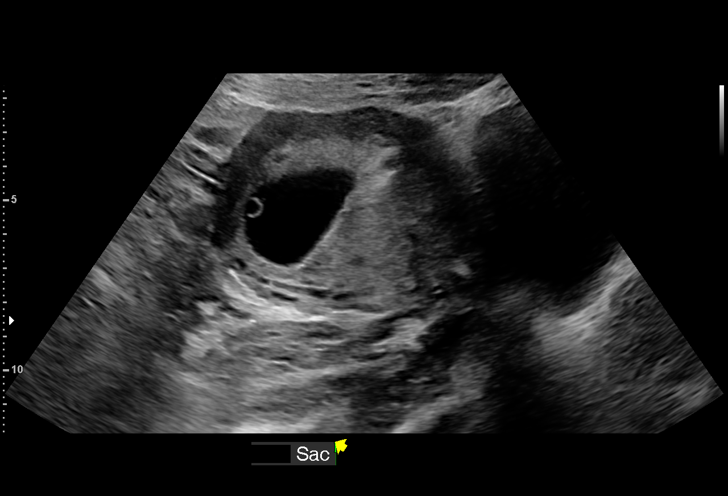
[im 19/36]
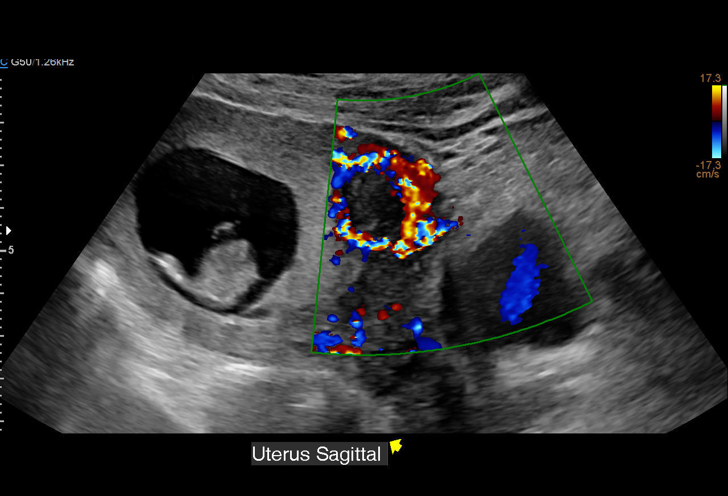
[im 20/36]
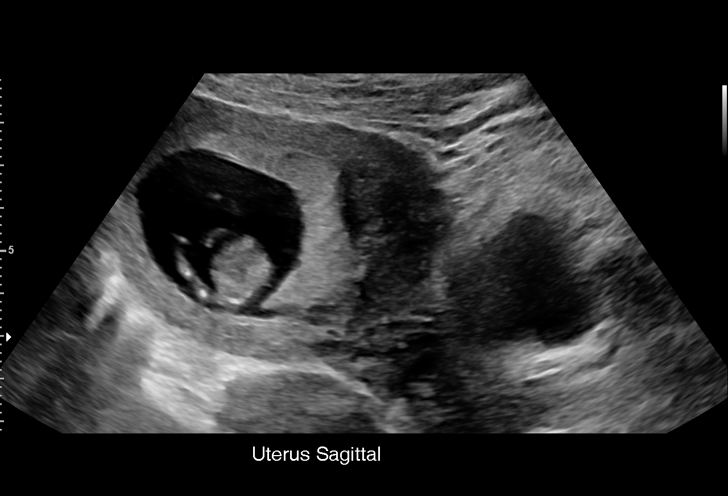
[im 23/36]
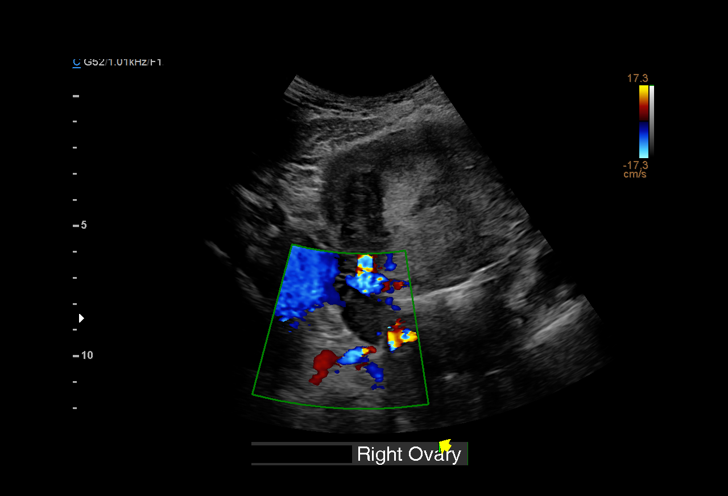
[im 25/36]
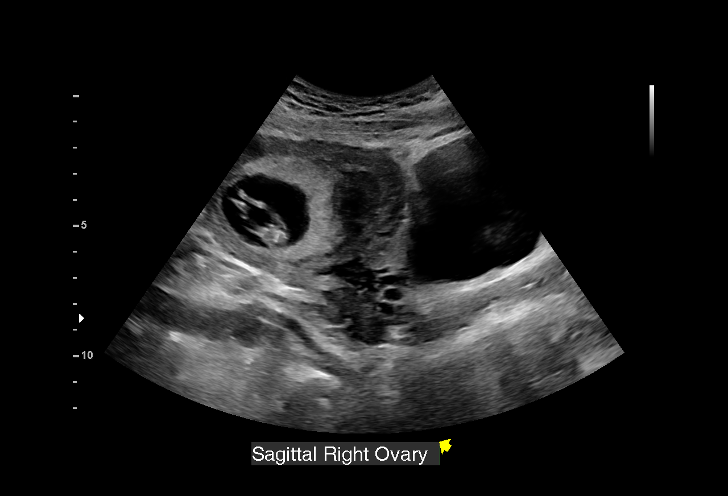
[im 28/36]
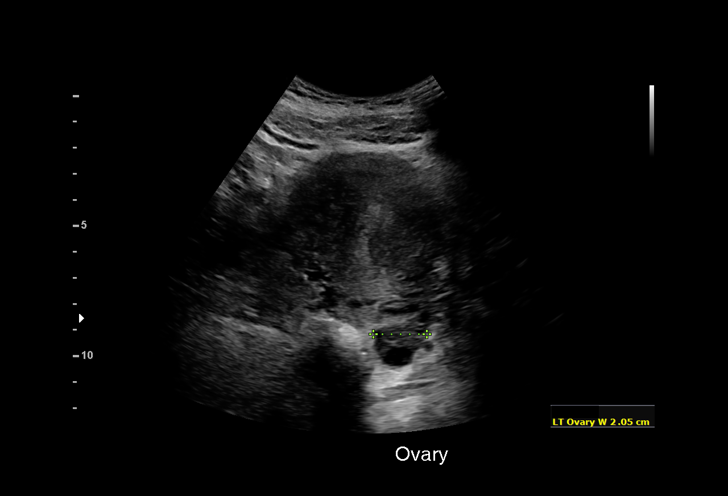
[im 30/36]
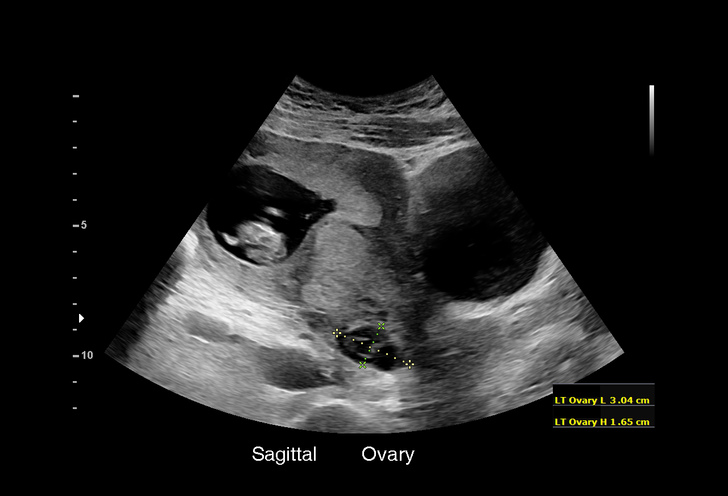
[im 33/36]
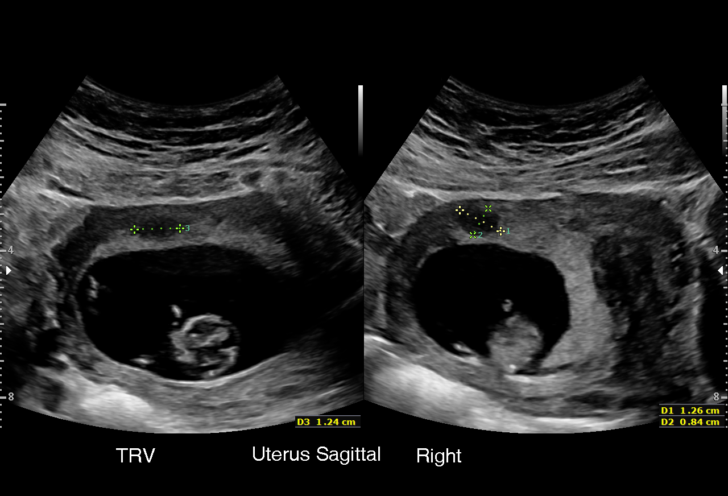
[im 36/36]
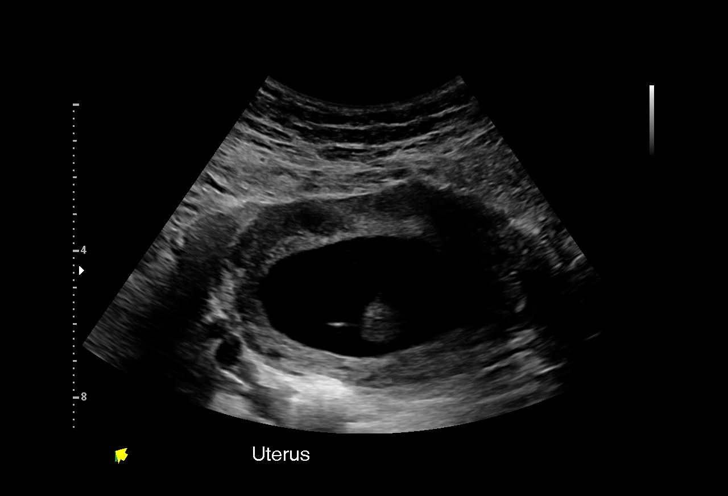

[15 of 28 positions shown; findings below may reference images not displayed]

FINDINGS: Intrauterine gestational sac: Single

Yolk sac:  Visualized.

Embryo:  Visualized.

Cardiac Activity: Visualized.

Heart Rate: 169 bpm

CRL:   47.3  mm   11 w 4 d                  US EDC: 08/17/17

Subchorionic hemorrhage:  None visualized.

Maternal uterus/adnexae:

Subchorionic hemorrhage: None

Right ovary: Normal

Left ovary: Normal

Other :2 fibroids are identified anteriorly measuring up to 2.1 cm

Free fluid:  None
IMPRESSION: 1. Single living intrauterine gestation with an estimated
gestational age of 11 weeks and 4 days. Embryonic heart rate equals
169 BPM.
2. Small uterine fibroids.

## 2024-04-27 ENCOUNTER — Other Ambulatory Visit (HOSPITAL_COMMUNITY)
Admission: RE | Admit: 2024-04-27 | Discharge: 2024-04-27 | Disposition: A | Discharge status: Home / Self Care | Payer: PRIVATE HEALTH INSURANCE | Source: Ambulatory Visit | Attending: Obstetrics and Gynecology | Admitting: Obstetrics and Gynecology

## 2024-04-27 ENCOUNTER — Other Ambulatory Visit: Payer: Self-pay | Admitting: Obstetrics and Gynecology

## 2024-04-27 DIAGNOSIS — Z01419 Encounter for gynecological examination (general) (routine) without abnormal findings: Secondary | ICD-10-CM | POA: Insufficient documentation

## 2024-05-03 LAB — CYTOLOGY - PAP
Comment: NEGATIVE
Diagnosis: NEGATIVE
High risk HPV: NEGATIVE
